# Patient Record
Sex: Female | Born: 1945 | Race: Black or African American | Hispanic: No | Smoking: Former smoker
Health system: Southern US, Community
[De-identification: ages and names within clinical notes are randomized; demographics above are authoritative.]

## PROBLEM LIST (undated history)

## (undated) DIAGNOSIS — I10 Essential (primary) hypertension: Secondary | ICD-10-CM

## (undated) DIAGNOSIS — N189 Chronic kidney disease, unspecified: Secondary | ICD-10-CM

## (undated) DIAGNOSIS — I219 Acute myocardial infarction, unspecified: Secondary | ICD-10-CM

## (undated) DIAGNOSIS — J449 Chronic obstructive pulmonary disease, unspecified: Secondary | ICD-10-CM

## (undated) DIAGNOSIS — E785 Hyperlipidemia, unspecified: Secondary | ICD-10-CM

## (undated) DIAGNOSIS — M1990 Unspecified osteoarthritis, unspecified site: Secondary | ICD-10-CM

## (undated) HISTORY — DX: Essential (primary) hypertension: I10

## (undated) HISTORY — DX: Hyperlipidemia, unspecified: E78.5

## (undated) HISTORY — DX: Chronic kidney disease, unspecified: N18.9

## (undated) HISTORY — DX: Unspecified osteoarthritis, unspecified site: M19.90

## (undated) HISTORY — DX: Acute myocardial infarction, unspecified: I21.9

## (undated) HISTORY — DX: Chronic obstructive pulmonary disease, unspecified: J44.9

---

## 2010-09-25 ENCOUNTER — Emergency Department: Payer: Self-pay | Admitting: Internal Medicine

## 2010-10-23 ENCOUNTER — Encounter: Payer: Self-pay | Admitting: Cardiology

## 2010-10-25 ENCOUNTER — Encounter: Payer: Self-pay | Admitting: Cardiology

## 2010-11-24 ENCOUNTER — Encounter: Payer: Self-pay | Admitting: Cardiology

## 2010-12-25 ENCOUNTER — Encounter: Payer: Self-pay | Admitting: Cardiology

## 2013-10-04 DIAGNOSIS — Z9889 Other specified postprocedural states: Secondary | ICD-10-CM | POA: Insufficient documentation

## 2013-10-04 DIAGNOSIS — I10 Essential (primary) hypertension: Secondary | ICD-10-CM | POA: Insufficient documentation

## 2013-10-04 DIAGNOSIS — I219 Acute myocardial infarction, unspecified: Secondary | ICD-10-CM | POA: Insufficient documentation

## 2013-10-04 DIAGNOSIS — E785 Hyperlipidemia, unspecified: Secondary | ICD-10-CM | POA: Insufficient documentation

## 2014-10-08 ENCOUNTER — Other Ambulatory Visit: Payer: Self-pay | Admitting: Family Medicine

## 2014-10-08 DIAGNOSIS — Z1231 Encounter for screening mammogram for malignant neoplasm of breast: Secondary | ICD-10-CM

## 2015-03-11 DIAGNOSIS — N183 Chronic kidney disease, stage 3 unspecified: Secondary | ICD-10-CM | POA: Insufficient documentation

## 2015-09-11 DIAGNOSIS — I5032 Chronic diastolic (congestive) heart failure: Secondary | ICD-10-CM | POA: Insufficient documentation

## 2016-09-28 DIAGNOSIS — M109 Gout, unspecified: Secondary | ICD-10-CM | POA: Insufficient documentation

## 2016-09-28 DIAGNOSIS — M329 Systemic lupus erythematosus, unspecified: Secondary | ICD-10-CM | POA: Insufficient documentation

## 2018-01-25 ENCOUNTER — Other Ambulatory Visit: Payer: Self-pay | Admitting: Family Medicine

## 2018-01-25 DIAGNOSIS — Z1231 Encounter for screening mammogram for malignant neoplasm of breast: Secondary | ICD-10-CM

## 2018-01-25 DIAGNOSIS — Z780 Asymptomatic menopausal state: Secondary | ICD-10-CM

## 2019-02-19 DIAGNOSIS — N2581 Secondary hyperparathyroidism of renal origin: Secondary | ICD-10-CM | POA: Insufficient documentation

## 2019-02-19 DIAGNOSIS — R600 Localized edema: Secondary | ICD-10-CM | POA: Insufficient documentation

## 2019-02-19 DIAGNOSIS — M109 Gout, unspecified: Secondary | ICD-10-CM | POA: Insufficient documentation

## 2019-03-07 ENCOUNTER — Ambulatory Visit: Payer: Medicare HMO

## 2019-04-04 DIAGNOSIS — M1A00X0 Idiopathic chronic gout, unspecified site, without tophus (tophi): Secondary | ICD-10-CM | POA: Insufficient documentation

## 2019-04-04 DIAGNOSIS — R768 Other specified abnormal immunological findings in serum: Secondary | ICD-10-CM | POA: Insufficient documentation

## 2019-04-04 DIAGNOSIS — I890 Lymphedema, not elsewhere classified: Secondary | ICD-10-CM | POA: Insufficient documentation

## 2019-04-04 DIAGNOSIS — M25561 Pain in right knee: Secondary | ICD-10-CM | POA: Insufficient documentation

## 2019-07-10 ENCOUNTER — Encounter: Payer: Self-pay | Admitting: Urology

## 2019-07-18 ENCOUNTER — Other Ambulatory Visit: Payer: Self-pay

## 2019-07-18 ENCOUNTER — Encounter: Payer: Self-pay | Admitting: Urology

## 2019-07-18 ENCOUNTER — Ambulatory Visit: Payer: Medicare HMO | Admitting: Urology

## 2019-07-18 VITALS — BP 119/65 | HR 65 | Ht 66.0 in | Wt 245.0 lb

## 2019-07-18 DIAGNOSIS — R8281 Pyuria: Secondary | ICD-10-CM | POA: Diagnosis not present

## 2019-07-18 DIAGNOSIS — N3941 Urge incontinence: Secondary | ICD-10-CM

## 2019-07-18 NOTE — Progress Notes (Signed)
   07/18/2019 8:56 AM   Bridget Murphy [birth year] [id number]  Referring provider: Lynnell Jude, MD [address] [place],  [place] [id number]  Chief Complaint  Patient presents with  . Hematuria    HPI: Bridget Murphy is a 74 yo seen at the request of Dr. Candiss Norse for evaluation of hematuria.  -Followed by nephrology for chronic kidney disease stage IIIa -UA 07/03/2019 with 20-40 RBC however 40-60 WBCs -Previous UAs dating back to 2019 show chronic pyuria with cultures showing mixed flora -Denies gross hematuria, dysuria -Mild urge incontinence -No flank, abdominal, pelvic pain -No recent upper tract imaging  PMH: History reviewed. No pertinent past medical history.  Surgical History: History reviewed. No pertinent surgical history.  Home Medications:  Allergies as of 07/18/2019      Reactions   Furosemide Rash      Medication List       Accurate as of Jul 18, 2019  8:56 AM. If you have any questions, ask your nurse or doctor.        allopurinol 100 MG tablet Commonly known as: ZYLOPRIM Take by mouth.   aspirin 81 MG EC tablet Take by mouth.   atorvastatin 40 MG tablet Commonly known as: LIPITOR Take by mouth.   clopidogrel 75 MG tablet Commonly known as: PLAVIX   diclofenac Sodium 1 % Gel Commonly known as: VOLTAREN Apply topically.   furosemide 20 MG tablet Commonly known as: LASIX   lisinopril-hydrochlorothiazide 10-12.5 MG tablet Commonly known as: ZESTORETIC Take by mouth.   metoprolol succinate 50 MG 24 hr tablet Commonly known as: TOPROL-XL Take by mouth.   nitroGLYCERIN 0.4 MG SL tablet Commonly known as: NITROSTAT   Tums Ultra 1000 400 MG chewable tablet Generic drug: calcium elemental as carbonate Chew by mouth.       Allergies:  Allergies  Allergen Reactions  . Furosemide Rash    Family History: History reviewed. No pertinent family history.  Social History:  reports that she has never smoked. She has never used smokeless  tobacco. She reports current alcohol use. No history on file for drug.   Physical Exam: BP 119/65   Pulse 65   Ht 5\' 6"  (1.676 m)   Wt 245 lb (111.1 kg)   BMI 39.54 kg/m   Constitutional:  Alert and oriented, No acute distress. HEENT: [place] AT, moist mucus membranes.  Trachea midline, no masses. Cardiovascular: No clubbing, cyanosis, or edema. Respiratory: Normal respiratory effort, no increased work of breathing. GI: Abdomen is soft, nontender, nondistended, no abdominal masses GU: No CVA tenderness Skin: No rashes, bruises or suspicious lesions. Neurologic: Grossly intact, no focal deficits, moving all 4 extremities. Psychiatric: Normal mood and affect.   Assessment & Plan:    1.  Pyuria UAs dating back to 2019 with chronic pyuria.  Most recent UA with microhematuria had significant pyuria at 40-60 WBCs.  UA today with 6-10 WBC/3-10 RBC. Recommend further evaluation with renal ultrasound and cystoscopy. The procedures were discussed and she desires to proceed   Abbie Sons, MD  [facility] [address], [place],  [address] [phone]

## 2019-07-18 NOTE — Addendum Note (Signed)
Addended by: Chrystie Nose on: 07/18/2019 09:17 AM   Modules accepted: Orders

## 2019-07-19 LAB — URINALYSIS, COMPLETE
Bilirubin, UA: NEGATIVE
Glucose, UA: NEGATIVE
Nitrite, UA: NEGATIVE
Specific Gravity, UA: >1.03 — ABNORMAL HIGH (ref 1.005–1.030)
Urobilinogen, Ur: 0.2 mg/dL (ref 0.2–1.0)
pH, UA: 5.5 (ref 5.0–7.5)

## 2019-07-19 LAB — MICROSCOPIC EXAMINATION

## 2019-07-22 LAB — CULTURE, URINE COMPREHENSIVE

## 2019-07-26 ENCOUNTER — Ambulatory Visit: Payer: Self-pay | Admitting: Urology

## 2019-08-02 ENCOUNTER — Ambulatory Visit: Admission: RE | Admit: 2019-08-02 | Payer: Self-pay | Source: Ambulatory Visit

## 2019-08-02 ENCOUNTER — Ambulatory Visit
Admission: RE | Admit: 2019-08-02 | Discharge: 2019-08-02 | Disposition: A | Discharge status: Home / Self Care | Payer: Medicare HMO | Source: Ambulatory Visit | Attending: Urology | Admitting: Urology

## 2019-08-02 ENCOUNTER — Other Ambulatory Visit: Payer: Self-pay

## 2019-08-02 DIAGNOSIS — R8281 Pyuria: Secondary | ICD-10-CM | POA: Insufficient documentation

## 2019-08-02 DIAGNOSIS — N3941 Urge incontinence: Secondary | ICD-10-CM | POA: Insufficient documentation

## 2019-08-02 DIAGNOSIS — Z79899 Other long term (current) drug therapy: Secondary | ICD-10-CM | POA: Diagnosis not present

## 2019-08-02 DIAGNOSIS — N1831 Chronic kidney disease, stage 3a: Secondary | ICD-10-CM | POA: Insufficient documentation

## 2019-08-18 ENCOUNTER — Encounter (INDEPENDENT_AMBULATORY_CARE_PROVIDER_SITE_OTHER): Payer: Self-pay

## 2019-08-18 ENCOUNTER — Other Ambulatory Visit: Payer: Self-pay

## 2019-08-18 ENCOUNTER — Other Ambulatory Visit: Payer: Self-pay | Admitting: Nephrology

## 2019-08-18 ENCOUNTER — Encounter: Payer: Self-pay | Admitting: Urology

## 2019-08-18 ENCOUNTER — Ambulatory Visit: Payer: Medicare HMO | Admitting: Urology

## 2019-08-18 VITALS — BP 113/71 | HR 52 | Ht 66.0 in | Wt 245.0 lb

## 2019-08-18 DIAGNOSIS — R3129 Other microscopic hematuria: Secondary | ICD-10-CM

## 2019-08-18 DIAGNOSIS — R8281 Pyuria: Secondary | ICD-10-CM | POA: Diagnosis not present

## 2019-08-18 DIAGNOSIS — N939 Abnormal uterine and vaginal bleeding, unspecified: Secondary | ICD-10-CM

## 2019-08-18 NOTE — Progress Notes (Signed)
° °  08/18/19  CC:  Chief Complaint  Patient presents with   Cysto    HPI: Refer to my prior note 07/18/2019.  Daughter states today she has had vaginal bleeding which has been intermittent and recurrent  Blood pressure 113/71, pulse (!) 52, height 5\' 6"  (1.676 m), weight 245 lb (111.1 kg). NED. A&Ox3.   No respiratory distress   Abd soft, NT, ND Atrophic external genitalia with patent urethral meatus; blood noted vaginal vault  Imaging: Normal renal ultrasound  Cystoscopy Procedure Note  Patient identification was confirmed, informed consent was obtained, and patient was prepped using Betadine solution.  Lidocaine jelly was administered per urethral meatus.    Procedure: - Flexible cystoscope introduced, without any difficulty.   - Thorough search of the bladder revealed:    normal urethral meatus    normal urothelium    no stones    no ulcers     no tumors    no urethral polyps    no trabeculation  - Ureteral orifices were normal in position and appearance.  Post-Procedure: - Patient tolerated the procedure well  Assessment/ Plan: -No mucosal abnormalities on cystoscopy -Questionable vaginal contamination on UAs with recurrent vaginal bleeding -Recommend gynecology evaluation -Catheterized urine obtained   Abbie Sons, MD

## 2019-08-21 ENCOUNTER — Telehealth: Payer: Self-pay | Admitting: Obstetrics & Gynecology

## 2019-08-21 LAB — URINALYSIS, COMPLETE
Bilirubin, UA: NEGATIVE
Glucose, UA: NEGATIVE
Nitrite, UA: NEGATIVE
Specific Gravity, UA: 1.025 (ref 1.005–1.030)
Urobilinogen, Ur: 0.2 mg/dL (ref 0.2–1.0)
pH, UA: 5 (ref 5.0–7.5)

## 2019-08-21 LAB — MICROSCOPIC EXAMINATION

## 2019-08-21 NOTE — Telephone Encounter (Signed)
[facility] referring for Vaginal bleeding. Called and left voicemail for patient to call back to be scheduled.

## 2019-08-22 LAB — CULTURE, URINE COMPREHENSIVE

## 2019-08-23 NOTE — Telephone Encounter (Signed)
Called and left voicemail for patient to call back to be scheduled. 

## 2019-08-24 NOTE — Telephone Encounter (Signed)
Called and left voicemail for patient to call back to be scheduled. 

## 2019-08-25 ENCOUNTER — Telehealth: Payer: Self-pay

## 2019-08-25 ENCOUNTER — Telehealth: Payer: Self-pay | Admitting: Family Medicine

## 2019-08-25 NOTE — Telephone Encounter (Signed)
-----   Message from Abbie Sons, MD sent at 08/25/2019  9:04 AM EDT ----- Urine culture showed no evidence of infection

## 2019-08-25 NOTE — Telephone Encounter (Signed)
Left pt mess to call no DPR on file

## 2019-08-25 NOTE — Telephone Encounter (Signed)
LMOM informed patient of normal urine culture.

## 2019-08-29 NOTE — Telephone Encounter (Signed)
Called and left voicemail for patient to call back to be scheduled. 

## 2019-09-08 ENCOUNTER — Ambulatory Visit: Admission: RE | Admit: 2019-09-08 | Payer: Medicare HMO | Source: Ambulatory Visit

## 2019-09-15 ENCOUNTER — Ambulatory Visit: Payer: Medicare HMO

## 2019-09-29 ENCOUNTER — Encounter: Payer: Self-pay | Admitting: Obstetrics and Gynecology

## 2019-09-29 ENCOUNTER — Ambulatory Visit (INDEPENDENT_AMBULATORY_CARE_PROVIDER_SITE_OTHER): Payer: Medicare HMO | Admitting: Obstetrics and Gynecology

## 2019-09-29 ENCOUNTER — Other Ambulatory Visit: Payer: Self-pay

## 2019-09-29 VITALS — BP 126/82 | Wt 254.0 lb

## 2019-09-29 DIAGNOSIS — N950 Postmenopausal bleeding: Secondary | ICD-10-CM

## 2019-09-29 NOTE — Progress Notes (Signed)
Gynecology H&P  Chief Complaint:  Chief Complaint  Patient presents with   Vaginal Bleeding    Referred by [facility]    History of Present Illness: Patient is a 74 y.o. No obstetric history on file. presents evaluation of postmenopausal bleeding. The patient states she has had a multiple episode(s) of bleeding in the past several year(s).  he bleeding has been limited to spotting and has not contained clots . She describes the blood as pink to dark brown in appearance.  She has had no additional complaints. She deniestrauma or other inciting event, bloating, cramping and early satiety. Does occasionally have lower abdominal pain.  She does not have a history of abnormal pap smears. She is on Plavix.  There are no other aggravating factors reported. There are no alleviating factors reported. The patient's past medical history is notable for renal disease, .   She has nothad prior work up for postmenopausal bleeding.  The patient also reports that she has an IUD in place placed in the 1980's.   Review of Systems: 10 point review of systems negative unless otherwise noted in HPI  Past Medical History:  Patient Active Problem List   Diagnosis Date Noted   Bilateral chronic knee pain 04/04/2019   Ds DNA antibody positive 04/04/2019    Formatting of this note might be different from the original.   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 Toes both feet    Systemic lupus erythematosus ([facility]) 09/28/2016   Chronic diastolic CHF (congestive heart failure) ([facility]) 09/11/2015   Stage 3 chronic kidney disease 03/11/2015   Acute myocardial infarction, unspecified ([facility]) 10/04/2013    Formatting of this note might be  different from the original. ACUTE ANTERIOR MI 09/25/10 Formatting of this note might be different from the original. ACUTE ANTERIOR MI 09/25/10    Essential hypertension 10/04/2013   Hyperlipidemia 10/04/2013   Morbid obesity ([facility]) 10/04/2013   Other specified postprocedural states 10/04/2013    Formatting of this note might be different from the original. 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  Family History:  No family history on file.  Social History:  Social History   Socioeconomic History   Marital status: Widowed    Spouse name: Not on file   Number of children: Not on file   Years of education: Not on file   Highest education level: Not on file  Occupational History   Not on file  Tobacco Use   Smoking status: Never Smoker   Smokeless tobacco: Never Used  Substance and Sexual Activity   Alcohol use: Yes   Drug use: Not on file   Sexual activity: Not on file  Other Topics Concern   Not on file  Social History Narrative   Not on file   Social Determinants of Health   Financial Resource Strain:    Difficulty of Paying Living Expenses:   Food Insecurity:    Worried About [place] in the Last Year:  Ran Out of Food in the Last Year:   Transportation Needs:    Film/video editor (Medical):    Lack of Transportation (Non-Medical):   Physical Activity:    Days of Exercise per Week:    Minutes of Exercise per Session:   Stress:    Feeling of Stress :   Social Connections:    Frequency of Communication with Friends and Family:    Frequency of Social Gatherings with Friends and Family:    Attends Religious Services:    Active Member of Clubs or Organizations:     Attends Archivist Meetings:    Marital Status:   Intimate Partner Violence:    Fear of Current or Ex-Partner:    Emotionally Abused:    Physically Abused:    Sexually Abused:     Allergies:  Allergies  Allergen Reactions   Furosemide Rash    Medications: Prior to Admission medications   Medication Sig Start Date End Date Taking? Authorizing Provider  allopurinol (ZYLOPRIM) 100 MG tablet Take by mouth. 08/15/15   [provider]  aspirin 81 MG EC tablet Take by mouth.    [provider]  atorvastatin (LIPITOR) 40 MG tablet Take by mouth.    [provider]  calcium elemental as carbonate (TUMS ULTRA 1000) 400 MG chewable tablet Chew by mouth.    [provider]  clopidogrel (PLAVIX) 75 MG tablet  07/08/19   [provider]  diclofenac Sodium (VOLTAREN) 1 % GEL Apply topically. 05/30/19   [provider]  furosemide (LASIX) 20 MG tablet  02/27/19   [provider]  lisinopril-hydrochlorothiazide (ZESTORETIC) 10-12.5 MG tablet Take by mouth.    [provider]  metoprolol succinate (TOPROL-XL) 50 MG 24 hr tablet Take by mouth.    [provider]  nitroGLYCERIN (NITROSTAT) 0.4 MG SL tablet  02/28/19   [provider]    Physical Exam Vitals: There were no vitals taken for this visit.  General: NAD HEENT: normocephalic, anicteric Pulmonary: No increased work of breathing Extremities: no edema, erythema, or tenderness Neurologic: Grossly intact Psychiatric: mood appropriate, affect full  Assessment: 74 y.o. No obstetric history on file. presenting for evaluation of postmenopausal bleeding  Plan: Problem List Items Addressed This Visit    None    Visit Diagnoses    Postmenopausal bleeding    -  Primary   Relevant Orders   US Transvaginal Non-OB      1) We discussed that menopause is a clinical diagnosis made after 12 months of amenorrhea.  The average age of menopause  in the  General [place] population is 84 but there may be significant variation.  Any bleeding that happens after a 12 month period of amenorrhea warrants further work.  Possible etiologies of postmenopausal bleeding were discussed with the patient today.  These may range from benign etiologies such as urethral prolapse and atrophy, to indeterminate lesions such as submucosal fibroids or polyps which would require resection to accurately evaluate. The role of unopposed estrogen in the development of  dndometrial hyperplasia or carcinoma is discussed.  The risk of endometrial hyperplasia is linearly correlated with increasing BMI given the production of estrone by adipose tissue.  Work up will be include transvaginal ultrasound to assess the thickness of the endometrial lining as well as to assess for focal uterine lesions.  Negative ultrasound evaluation, defined as the absence of focal lesions and endometrial stripe of <53mm, effectively rules out carcinoma and confirms atrophy as the  most likely etiology.  Should focal lesions be present these generally require hysteroscopic resection.  Should lining be greater >69mm endometrial biopsy is warranted to rule out hyperplasia or frank endometrial cancer.  Continued episodes of bleeding despite negative ultrasound also warrant endometrial sampling.  As the cervical pathology may also be implicated in postmenopausal bleeding prior cervical cytology was reviewed and repeated if required per ASCCP guidelines.   2) Evaluation by pelvc ultrasound scheduled, with follow up after [place]. EMB discussed and may be performed as well. Pros and cons of these modalities of testing discussed. If IUD is confirmed to be in place will attempt removal.  Will ensure procedure room is available given patient's limited mobility  3) Return in about 2 weeks (around 10/13/2019) for 1.5 - 2 weeks TVUS and follow up (procedure room).   Malachy Mood, MD, [facility], [facility] 09/29/2019, 1:28 PM

## 2019-10-23 ENCOUNTER — Other Ambulatory Visit: Payer: Self-pay | Admitting: Obstetrics and Gynecology

## 2019-10-23 ENCOUNTER — Ambulatory Visit (INDEPENDENT_AMBULATORY_CARE_PROVIDER_SITE_OTHER): Payer: Medicare HMO | Admitting: Obstetrics and Gynecology

## 2019-10-23 ENCOUNTER — Other Ambulatory Visit (HOSPITAL_COMMUNITY)
Admission: RE | Admit: 2019-10-23 | Discharge: 2019-10-23 | Disposition: A | Discharge status: Home / Self Care | Payer: Medicare HMO | Source: Ambulatory Visit | Attending: Obstetrics and Gynecology | Admitting: Obstetrics and Gynecology

## 2019-10-23 ENCOUNTER — Other Ambulatory Visit: Payer: Self-pay

## 2019-10-23 ENCOUNTER — Ambulatory Visit (INDEPENDENT_AMBULATORY_CARE_PROVIDER_SITE_OTHER): Payer: Medicare HMO

## 2019-10-23 DIAGNOSIS — N950 Postmenopausal bleeding: Secondary | ICD-10-CM

## 2019-10-23 DIAGNOSIS — R9389 Abnormal findings on diagnostic imaging of other specified body structures: Secondary | ICD-10-CM | POA: Insufficient documentation

## 2019-10-23 DIAGNOSIS — N840 Polyp of corpus uteri: Secondary | ICD-10-CM | POA: Insufficient documentation

## 2019-10-23 NOTE — Progress Notes (Signed)
Gynecology Ultrasound Follow Up  Chief Complaint: No chief complaint on file.    History of Present Illness: Patient is a 74 y.o. female who presents today for ultrasound evaluation of PMB .  Ultrasound demonstrates the following findgins Adnexa: no masses seen Uterus: Non-enlarged with endometrial stripe heterogenous and measuring up to 70mm Additional: NO IUD VISUALIZED  Review of Systems: Review of Systems  Constitutional: Negative.   Gastrointestinal: Negative.   Genitourinary: Negative.   Neurological: Seizures:      Past Medical History:  No past medical history on file.  Past Surgical History:  No past surgical history on file.  Gynecologic History:  No LMP recorded. Contraception: post menopausal status  Family History:  No family history on file.  Social History:  Social History   Socioeconomic History  . Marital status: Widowed    Spouse name: Not on file  . Number of children: Not on file  . Years of education: Not on file  . Highest education level: Not on file  Occupational History  . Not on file  Tobacco Use  . Smoking status: Never Smoker  . Smokeless tobacco: Never Used  Substance and Sexual Activity  . Alcohol use: Yes  . Drug use: Not on file  . Sexual activity: Not on file  Other Topics Concern  . Not on file  Social History Narrative  . Not on file   Social Determinants of Health   Financial Resource Strain:   . Difficulty of Paying Living Expenses: Not on file  Food Insecurity:   . Worried About Charity fundraiser in the Last Year: Not on file  . Ran Out of Food in the Last Year: Not on file  Transportation Needs:   . Lack of Transportation (Medical): Not on file  . Lack of Transportation (Non-Medical): Not on file  Physical Activity:   . Days of Exercise per Week: Not on file  . Minutes of Exercise per Session: Not on file  Stress:   . Feeling of Stress : Not on file  Social Connections:   . Frequency of  Communication with Friends and Family: Not on file  . Frequency of Social Gatherings with Friends and Family: Not on file  . Attends Religious Services: Not on file  . Active Member of Clubs or Organizations: Not on file  . Attends Archivist Meetings: Not on file  . Marital Status: Not on file  Intimate Partner Violence:   . Fear of Current or Ex-Partner: Not on file  . Emotionally Abused: Not on file  . Physically Abused: Not on file  . Sexually Abused: Not on file    Allergies:  Allergies  Allergen Reactions  . Furosemide Rash    Medications: Prior to Admission medications   Medication Sig Start Date End Date Taking? Authorizing Provider  allopurinol (ZYLOPRIM) 100 MG tablet Take by mouth. 08/15/15   [provider]  aspirin 81 MG EC tablet Take by mouth.    [provider]  atorvastatin (LIPITOR) 40 MG tablet Take by mouth.    [provider]  calcium elemental as carbonate (TUMS ULTRA 1000) 400 MG chewable tablet Chew by mouth.    [provider]  clopidogrel (PLAVIX) 75 MG tablet  07/08/19   [provider]  diclofenac Sodium (VOLTAREN) 1 % GEL Apply topically. 05/30/19   [provider]  furosemide (LASIX) 20 MG tablet  02/27/19   [provider]  lisinopril-hydrochlorothiazide (ZESTORETIC) 10-12.5 MG tablet  Take by mouth.    [provider]  metoprolol succinate (TOPROL-XL) 50 MG 24 hr tablet Take by mouth.    [provider]  nitroGLYCERIN (NITROSTAT) 0.4 MG SL tablet  02/28/19   [provider]    Physical Exam Vitals: There were no vitals taken for this visit.  General: NAD HEENT: normocephalic, anicteric Pulmonary: No increased work of breathing Extremities: no edema, erythema, or tenderness Neurologic: Grossly intact, normal gait Psychiatric: mood appropriate, affect full  US PELVIC COMPLETE WITH TRANSVAGINAL  Result Date: 10/23/2019 Patient Name: Bridget Murphy DOB:  Mar 21, 1945 MRN: [id number] ULTRASOUND REPORT Location: [facility] Date of Service: 10/23/2019 Indications: Postmenopausal bleeding Findings: The uterus is anteverted and measures 11.3 x 7.0 x 6.0 cm. Echo texture is heterogenous with evidence of focal masses. Within the uterus There are possibly submucosal fibroids vs. Polyps measuring 25.2 x 21.6 x 31.3 mm, 16.7 x 8.3 x 13.8 mm, 12.9 x 8.9 x 12.3 mm. There is also a thick endometrium. The lining in the cervical canal is thick also. The Endometrium measures 29.0 mm. No IUD is seen. Right Ovary measures 3.2 x 2.1 x 1.7 cm. It is normal in appearance. There is a follicle in the right ovary measuring 12.0 x 7.9 x 11.0 mm Left Ovary measures 2.5 x 2.2 x 1.0 cm. It is normal in appearance. Survey of the adnexa demonstrates no adnexal masses. There is no free fluid in the cul de sac. Impression: 1. There are possible fibroids and or polyps within the endometrium canal. 2. The endometrial lining appears thick, as well as, the cervical canal lining. 3. The ovaries appear normal. 4. No IUD is seen. Recommendations: 1.Clinical correlation with the patient's History and Physical Exam. Gweneth Dimitri, RT Images reviewed.  The endometrial lining appears thickened in the setting of postmenopausal bleeding.  There is evidence of focal lesions within the endometrium likely representing endometrial polyps. Malachy Mood, MD, [facility], [facility] 10/23/2019, 9:31 AM     ENDOMETRIAL BIOPSY     The indications for endometrial biopsy were reviewed.   Risks of the biopsy including cramping, bleeding, infection, uterine perforation, inadequate specimen and need for additional procedures  were discussed. The patient states she understands and agrees to undergo procedure today. Consent was signed. Time out was performed. Urine HCG was negative. A Graves speculum was placed and the cervix was brought into view.  The cervix was prepped with Betadine. A  single-toothed tenaculum was  placed on the anterior lip of the cervix for traction. A 3 mm pipelle was introduced through the cervix into the endometrial cavity without difficulty to a depth of 8cm, and a large amount of tissue was obtained, the resulting specime sent to pathology. The instruments were removed from the patient's vagina. Minimal bleeding from the cervix was noted. The patient tolerated the procedure well. Routine post-procedure instructions were given to the patient.  She will be contacted by phone one results become available.     Assessment: 74 y.o. postmenopausal bleeding work up  Plan: Problem List Items Addressed This Visit    None      1) PMB - We discussed that menopause is a clinical diagnosis made after 12 months of amenorrhea.  The average age of menopause in the  General [place] population is 28 but there may be significant variation.  Any bleeding that happens after a 12 month period of amenorrhea warrants further work.  Possible etiologies of postmenopausal bleeding were discussed with  the patient today.  These may range from benign etiologies such as urethral prolapse and atrophy, to indeterminate lesions such as submucosal fibroids or polyps which would require resection to accurately evaluate. The role of unopposed estrogen in the development of endometrial hyperplasia or carcinoma is discussed.  The risk of endometrial hyperplasia is linearly correlated with increasing BMI given the production of estrone by adipose tissue.  Work up will be include transvaginal ultrasound to assess the thickness of the endometrial lining as well as to assess for focal uterine lesions.  Negative ultrasound evaluation, defined as the absence of focal lesions and endometrial stripe of <45mm, effectively rules out carcinoma and confirms atrophy as the most likely etiology.  Should focal lesions be present these generally require hysteroscopic resection. Lining on today's ultrasound wasgreater >17mm  endometrial biopsy was warranted to rule out hyperplasia or frank endometrial cancer.   - EMBx today - post hysteroscopy D&C given focal lesion  2) A total of 15 minutes were spent in face-to-face contact with the patient during this encounter with over half of that time devoted to counseling and coordination of care.  3) Return if symptoms worsen or fail to improve.   Malachy Mood, MD, [facility], [facility] 10/23/2019, 9:46 AM

## 2019-10-24 ENCOUNTER — Telehealth: Payer: Self-pay | Admitting: Obstetrics and Gynecology

## 2019-10-24 LAB — SURGICAL PATHOLOGY

## 2019-10-24 NOTE — Telephone Encounter (Signed)
Attempted to contact patient to go over pathology results and set up Gynecology-Oncology referral but only got voicemail.  Instructed to call back office

## 2019-10-24 NOTE — Telephone Encounter (Signed)
Patient is calling back to speak with AMS.

## 2019-10-26 ENCOUNTER — Other Ambulatory Visit: Payer: Self-pay | Admitting: Obstetrics and Gynecology

## 2019-10-26 DIAGNOSIS — C541 Malignant neoplasm of endometrium: Secondary | ICD-10-CM

## 2019-10-26 NOTE — Progress Notes (Signed)
Called patient to inform of biopsy results.  Also updated daughter-in-law after talking with Mrs Abdo as she is her ride.  Haywood Filler, daughter-in-law, [phone] . It is up to date

## 2019-11-01 ENCOUNTER — Inpatient Hospital Stay: Payer: Medicare HMO | Attending: Obstetrics and Gynecology | Admitting: Obstetrics and Gynecology

## 2019-11-01 ENCOUNTER — Inpatient Hospital Stay: Payer: Medicare HMO

## 2019-11-01 ENCOUNTER — Other Ambulatory Visit: Payer: Self-pay

## 2019-11-01 VITALS — BP 128/62 | HR 56 | Temp 97.8°F | Resp 20 | Wt 233.0 lb

## 2019-11-01 DIAGNOSIS — Z7902 Long term (current) use of antithrombotics/antiplatelets: Secondary | ICD-10-CM | POA: Insufficient documentation

## 2019-11-01 DIAGNOSIS — D259 Leiomyoma of uterus, unspecified: Secondary | ICD-10-CM | POA: Diagnosis not present

## 2019-11-01 DIAGNOSIS — I5032 Chronic diastolic (congestive) heart failure: Secondary | ICD-10-CM | POA: Diagnosis not present

## 2019-11-01 DIAGNOSIS — Z79899 Other long term (current) drug therapy: Secondary | ICD-10-CM | POA: Diagnosis not present

## 2019-11-01 DIAGNOSIS — E785 Hyperlipidemia, unspecified: Secondary | ICD-10-CM | POA: Diagnosis not present

## 2019-11-01 DIAGNOSIS — Z791 Long term (current) use of non-steroidal anti-inflammatories (NSAID): Secondary | ICD-10-CM | POA: Insufficient documentation

## 2019-11-01 DIAGNOSIS — J449 Chronic obstructive pulmonary disease, unspecified: Secondary | ICD-10-CM | POA: Insufficient documentation

## 2019-11-01 DIAGNOSIS — G8929 Other chronic pain: Secondary | ICD-10-CM | POA: Insufficient documentation

## 2019-11-01 DIAGNOSIS — R7309 Other abnormal glucose: Secondary | ICD-10-CM | POA: Insufficient documentation

## 2019-11-01 DIAGNOSIS — C541 Malignant neoplasm of endometrium: Secondary | ICD-10-CM

## 2019-11-01 DIAGNOSIS — I252 Old myocardial infarction: Secondary | ICD-10-CM | POA: Insufficient documentation

## 2019-11-01 DIAGNOSIS — I130 Hypertensive heart and chronic kidney disease with heart failure and stage 1 through stage 4 chronic kidney disease, or unspecified chronic kidney disease: Secondary | ICD-10-CM | POA: Insufficient documentation

## 2019-11-01 DIAGNOSIS — N183 Chronic kidney disease, stage 3 unspecified: Secondary | ICD-10-CM | POA: Insufficient documentation

## 2019-11-01 DIAGNOSIS — Z7982 Long term (current) use of aspirin: Secondary | ICD-10-CM | POA: Insufficient documentation

## 2019-11-01 LAB — COMPREHENSIVE METABOLIC PANEL
ALT: 9 U/L (ref 0–44)
AST: 18 U/L (ref 15–41)
Albumin: 3.6 g/dL (ref 3.5–5.0)
Alkaline Phosphatase: 76 U/L (ref 38–126)
Anion gap: 10 (ref 5–15)
BUN: 24 mg/dL — ABNORMAL HIGH (ref 8–23)
CO2: 25 mmol/L (ref 22–32)
Calcium: 9.1 mg/dL (ref 8.9–10.3)
Chloride: 105 mmol/L (ref 98–111)
Creatinine, Ser: 1.18 mg/dL — ABNORMAL HIGH (ref 0.44–1.00)
GFR calc Af Amer: 52 mL/min — ABNORMAL LOW (ref >60)
GFR calc non Af Amer: 45 mL/min — ABNORMAL LOW (ref >60)
Glucose, Bld: 108 mg/dL — ABNORMAL HIGH (ref 70–99)
Potassium: 4.4 mmol/L (ref 3.5–5.1)
Sodium: 140 mmol/L (ref 135–145)
Total Bilirubin: 0.7 mg/dL (ref 0.3–1.2)
Total Protein: 7.3 g/dL (ref 6.5–8.1)

## 2019-11-01 LAB — CBC WITH DIFFERENTIAL/PLATELET
Abs Immature Granulocytes: 0.03 10*3/uL (ref 0.00–0.07)
Basophils Absolute: 0.1 10*3/uL (ref 0.0–0.1)
Basophils Relative: 1 %
Eosinophils Absolute: 0.2 10*3/uL (ref 0.0–0.5)
Eosinophils Relative: 2 %
HCT: 32.8 % — ABNORMAL LOW (ref 36.0–46.0)
Hemoglobin: 10.3 g/dL — ABNORMAL LOW (ref 12.0–15.0)
Immature Granulocytes: 0 %
Lymphocytes Relative: 16 %
Lymphs Abs: 1.3 10*3/uL (ref 0.7–4.0)
MCH: 26.5 pg (ref 26.0–34.0)
MCHC: 31.4 g/dL (ref 30.0–36.0)
MCV: 84.5 fL (ref 80.0–100.0)
Monocytes Absolute: 0.4 10*3/uL (ref 0.1–1.0)
Monocytes Relative: 5 %
Neutro Abs: 6.3 10*3/uL (ref 1.7–7.7)
Neutrophils Relative %: 76 %
Platelets: 215 10*3/uL (ref 150–400)
RBC: 3.88 MIL/uL (ref 3.87–5.11)
RDW: 15.5 % (ref 11.5–15.5)
WBC: 8.2 10*3/uL (ref 4.0–10.5)
nRBC: 0 % (ref 0.0–0.2)

## 2019-11-01 NOTE — Progress Notes (Addendum)
Gynecologic Oncology Consult Visit   Referring Provider: Malachy Mood, MD [address] [place],  [place] [address] [phone]  Chief Concern: high-grade endometrial adenocarcinoma  Subjective:  Bridget Murphy is a 74 y.o. female who is seen in consultation from Dr. Georgianne Fick for high-grade endometrial adenocarcinoma.  She presented with postmenopausal bleeding.   Pelvic US 10/23/2019 The uterus is anteverted and measures 11.3 x 7.0 x 6.0 cm. Echo texture is heterogenous with evidence of focal masses. Within the uterus There are possibly submucosal fibroids vs. Polyps measuring 25.2 x 21.6 x 31.3 mm, 16.7 x 8.3 x 13.8 mm, 12.9 x 8.9 x 12.3 mm. There is also a thick endometrium. The lining in the cervical canal is thick also.   The Endometrium measures 29.0 mm. No IUD is seen.   Right Ovary measures 3.2 x 2.1 x 1.7 cm. It is normal in appearance. There is a follicle in the right ovary measuring 12.0 x 7.9 x 11.0 mm Left Ovary measures 2.5 x 2.2 x 1.0 cm. It is normal in appearance. Survey of the adnexa demonstrates no adnexal masses. There is no free fluid in the cul de sac.  Impression: 1. There are possible fibroids and or polyps within the endometrium canal.  2. The endometrial lining appears thick, as well as, the cervical canal lining.  3. The ovaries appear normal.  4. No IUD is seen.    10/23/2019 Endometrial biopsy performed   A. ENDOMETRIUM, BIOPSY:  - High-grade endometrial adenocarcinoma, FIGO grade 3.  - The carcinoma has mostly endometrioid features.   She presents today to discuss management options. She also has significant medical history with h/o MI, CHF, COPD, stage 3 chronic kidney disease, and Body mass index is 37.61 kg/m. She doesn't have chest pain or shortness of breath. She can walk around her house and grocery store. She can walk up the 4 steps in/out of her home. She does not walk up a flight of stairs or walk a block.    Problem  List: Patient Active Problem List   Diagnosis Date Noted  . Endometrial adenocarcinoma ([place]) 11/01/2019  . Bilateral chronic knee pain 04/04/2019  . Ds DNA antibody positive 04/04/2019  . Lymphedema of both lower extremities 04/04/2019  . Hyperparathyroidism due to renal insufficiency ([place]) 02/19/2019  . Acute gout involving toe 09/28/2016  . Systemic lupus erythematosus ([place]) 09/28/2016  . Chronic diastolic CHF (congestive heart failure) ([place]) 09/11/2015  . Stage 3 chronic kidney disease 03/11/2015  . Acute myocardial infarction, unspecified ([place]) 10/04/2013  . Essential hypertension 10/04/2013  . Hyperlipidemia 10/04/2013  . Morbid obesity ([place]) 10/04/2013  . Other specified postprocedural states 10/04/2013    Past Medical History: Past Medical History:  Diagnosis Date  . Acute myocardial infarction [facility])    s/p three stents; anterior STEMI, DES LAD 09/25/2010  . Arthritis   . Chronic kidney disease   . COPD (chronic obstructive pulmonary disease) ([place])   . Hyperlipidemia   . Hypertension   . 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 No pertinent surgical history.  Past Gynecologic History:  As per HPI  OB History:  OB History  Gravida Para Term Preterm AB Living  1 1          SAB TAB Ectopic Multiple Live Births               # Outcome Date GA Lbr Len/2nd Weight Sex Delivery Anes PTL Lv  1 Para  Obstetric Comments  SVD x 1    Family History: Family History  Problem Relation Age of Onset  . Cancer Mother   . Cancer Sister   . Cancer Brother     Social History: Social History   Socioeconomic History  . Marital status: Widowed    Spouse name: Not on file  . Number of children: Not on file  . Years of education: Not on file  . Highest education level: Not on file  Occupational History  . Not on file  Tobacco Use  . Smoking status: Never Smoker  . Smokeless tobacco: Never Used  Vaping Use  . Vaping Use: Never  used  Substance and Sexual Activity  . Alcohol use: Not Currently  . Drug use: Not on file  . Sexual activity: Not on file  Other Topics Concern  . Not on file  Social History Narrative  . Not on file   Social Determinants of Health   Financial Resource Strain:   . Difficulty of Paying Living Expenses: Not on file  Food Insecurity:   . Worried About Charity fundraiser in the Last Year: Not on file  . Ran Out of Food in the Last Year: Not on file  Transportation Needs:   . Lack of Transportation (Medical): Not on file  . Lack of Transportation (Non-Medical): Not on file  Physical Activity:   . Days of Exercise per Week: Not on file  . Minutes of Exercise per Session: Not on file  Stress:   . Feeling of Stress : Not on file  Social Connections:   . Frequency of Communication with Friends and Family: Not on file  . Frequency of Social Gatherings with Friends and Family: Not on file  . Attends Religious Services: Not on file  . Active Member of Clubs or Organizations: Not on file  . Attends Archivist Meetings: Not on file  . Marital Status: Not on file  Intimate Partner Violence:   . Fear of Current or Ex-Partner: Not on file  . Emotionally Abused: Not on file  . Physically Abused: Not on file  . Sexually Abused: Not on file    Allergies: Allergies  Allergen Reactions  . Furosemide Rash    Current Medications: Current Outpatient Medications  Medication Sig Dispense Refill  . aspirin 81 MG EC tablet Take by mouth.    Marland Kitchen atorvastatin (LIPITOR) 40 MG tablet Take by mouth.    . clopidogrel (PLAVIX) 75 MG tablet     . diclofenac Sodium (VOLTAREN) 1 % GEL Apply topically.    . furosemide (LASIX) 20 MG tablet     . ibuprofen (ADVIL) 100 MG/5ML suspension Take 200 mg by mouth every 4 (four) hours as needed.    Marland Kitchen lisinopril-hydrochlorothiazide (ZESTORETIC) 10-12.5 MG tablet Take by mouth.    . metoprolol succinate (TOPROL-XL) 50 MG 24 hr tablet Take by mouth.     . nitroGLYCERIN (NITROSTAT) 0.4 MG SL tablet     . allopurinol (ZYLOPRIM) 100 MG tablet Take by mouth.    . calcium elemental as carbonate (TUMS ULTRA 1000) 400 MG chewable tablet Chew by mouth. (Patient not taking: Reported on 11/01/2019)     No current facility-administered medications for this visit.    Review of Systems General: negative for fevers, changes in weight or night sweats Skin: negative for changes in moles or sores or rash Eyes: negative for changes in vision HEENT: negative for change in hearing, tinnitus, voice changes Pulmonary: negative for  dyspnea, orthopnea, productive cough, wheezing Cardiac: negative for palpitations, pain Gastrointestinal: negative for nausea, vomiting, constipation, diarrhea, hematemesis, hematochezia Genitourinary/Sexual: negative for dysuria, retention, hematuria, incontinence Ob/Gyn:   abnormal bleeding Musculoskeletal: negative for pain, joint pain, back pain Hematology: negative for easy bruising, abnormal bleeding Neurologic/Psych: negative for headaches, seizures, paralysis, weakness, numbness  Objective:  Physical Examination:  BP 128/62   Pulse (!) 56   Temp 97.8 F (36.6 C)   Resp 20   Wt 233 lb (105.7 kg)   SpO2 100%   BMI 37.61 kg/m    ECOG Performance Status: 1 - Symptomatic but completely ambulatory  GENERAL: Patient is a female, appears stated age, in no acute distress HEENT:  PERRL, neck supple with midline trachea. No adenopathy.  NODES:  No cervical, supraclavicular, axillary, or inguinal lymphadenopathy palpated.  LUNGS:  Clear to auscultation bilaterally.  No wheezes or rhonchi. HEART:  Regular rate and rhythm.  ABDOMEN:  Soft, nontender. No masses or ascites or hernias.  EXTREMITIES:  No peripheral edema.   SKIN:  Clear with no obvious rashes or skin changes. No nail dyscrasia. NEURO:  Nonfocal. Well oriented.  Appropriate affect.  Pelvic: EGBUS: no lesions Cervix: no lesions, nontender, mobile Vagina: no  lesions, no discharge or bleeding Uterus: nontender, mobile, exam limited by habitus Adnexa: no palpable masses or nodularity.  Rectovaginal: not indicated    Lab Review Labs on site today: CBC, CMP, HbA1c  Radiologic Imaging: CT C/A/P       Assessment:  Chanin Frumkin is a 74 y.o. female diagnosed with high grade endometrial cancer.   Medical co-morbidities complicating care: HTN and obesity, prior MI s/p 3 stents on Plavix, h/o CHF, chronic kidney disease, and COPD.  Plan:   Problem List Items Addressed This Visit      Genitourinary   Endometrial adenocarcinoma ([place]) - Primary   Relevant Orders   Comprehensive metabolic panel (Completed)   CBC with Differential/Platelet (Completed)   Hemoglobin A1c   CT CHEST ABDOMEN PELVIS W CONTRAST      We discussed options for management including surgery and radiation. Hormonal therapy is not an option given grade of disease. She does have significant medical issues including cardiopulmonary disease and cardiology clearance will be needed. She does not think she has had a stress test recently. We will reach out to her cardiologist. She will probably need EKG, ECHO, and possibly dobutamine stress test.   We also ordered CBC, CMP and HbA1c as well as CT C/A/P to asess for metastatic disease.   Risks were discussed in detail. These include infection, anesthesia, bleeding, transfusion, wound separation, vaginal cuff dehiscence, medical issues (blood clots, stroke, heart attack, fluid in the lungs, pneumonia, abnormal heart rhythm, death), possible exploratory surgery with larger incision, lymphedema, lymphocyst, allergic reaction, injury to adjacent organs (bowel, bladder, blood vessels, nerves, ureters, uterus).   She will need VTE prophylaxis. Hold her Plavix 5 days before surgery and restart postoperatively.   I spoke with Dr. Georgianne Fick about her surgery. Given her comorbid conditions our plan will be to do her surgery at [facility]. I will need  to discuss this with her and her daughter.   Suggested return to clinic in  1-2 weeks postoperatively to review pathology.    The patient's diagnosis, an outline of the further diagnostic and laboratory studies which will be required, the recommendation, and alternatives were discussed.  All questions were answered to the patient's satisfaction.  A total of 80 minutes were spent with the patient/family today; >  50% was spent in education, counseling and coordination of care for endometrial cancer.    Bridget Murphy Gaetana Michaelis, MD    CC:  Malachy Mood, MD [address] [place],  [place] [address] [phone]

## 2019-11-01 NOTE — Patient Instructions (Addendum)
Cardiology Clearance with Dr. Saralyn Pilar on Monday at 11:45   In Preparation of your upcoming procedure you are having testing for Covid-19 on Monday, November 13, 2019 at the [facility]. This is a drive-thru testing site. Please come between 8:00am-1:00pm.  We are asking that you stay at home and avoid visitors from this point until after your procedure is completed to minimize potential for Covid-19 exposure.    Please Wash your hands frequently with soap and water or clean hands with an alcohol-based hand sanitizer often.  Do not touch your eyes, nose, and mouth, especially with unwashed hands.  Should you at any time develop new symptoms of fever, cough, sneezing or runny nose, sore throat, difficulty breathing, or unexplained body aches, we ask that you contact your provider.   It may take up to 48 hours for results of this testing to be made available.   You will not receive notification if test results are negative.  If test results are positive for Covid-19, your provider or his/her representative will notify you by phone, with additional instructions.                 DIVISION OF GYNECOLOGIC ONCOLOGY BOWEL PREP   The following instructions are extremely important to prepare for your surgery. Please follow them carefully   Step 1: Liquid Diet Instructions              Clear Liquid Diet for GYN Oncology Patients Day Before Surgery The day before your scheduled surgery DO NOT EAT any solid foods.  We do want you to drink enough liquids, but NO MILK products.  We do not want you to be dehydrated.  Clear liquids are defined as no milk products and no pieces of any solid food. Drink at least 64 oz. of fluid.  The following are all approved for you to drink the day before you surgery.  Chicken, Beef or Vegetable Broth (bouillon or consomm) - NO BROTH AFTER MIDNIGHT  Plain Jello  (no fruit)  Water  Strained lemonade or fruit punch  Gatorade (any flavor)  CLEAR  Ensure or Boost Breeze  Fruit juices without pulp, such as apple, grape, or cranberry juice  Clear sodas - NO SODA AFTER MIDNIGHT  Ice Pops without bits of fruit or fruit pulp  Honey  Tea or coffee without milk or cream                 Any foods not on the above list should be avoided                                                                                             Step 2: Laxatives           The evening before surgery:   Time: around 5pm   Follow these instructions carefully.   Administer 1 Dulcolax suppository according to manufacturer instructions on the box. You will need to purchase this laxative at a pharmacy or grocery store.    Individual responses to laxatives vary; this prep may cause multiple bowel movements. It often works in 30 minutes and  may take as long as 3 hours. Stay near an available bathroom.    It is important to stay hydrated. Ensure you are still drinking clear liquids.     IMPORTANT: FOR YOUR SAFETY, WE WILL HAVE TO CANCEL YOUR SURGERY IF YOU DO NOT FOLLOW THESE INSTRUCTIONS.    Do not eat anything after midnight (including gum or candy) prior to your surgery.  Avoid drinking carbonated beverages after midnight.  You can have clear liquids up until one hour before you arrive at the hospital. "Nothing by mouth" means no liquids, gum, candy, etc for one hour before your arrival time.      Laparoscopy Laparoscopy is a procedure to diagnose diseases in the abdomen. During the procedure, a thin, lighted, pencil-sized instrument called a laparoscope is inserted into the abdomen through an incision. The laparoscope allows your health care provider to look at the organs inside your body. LET [facility] CARE PROVIDER KNOW ABOUT:  Any allergies you have.  All medicines you are taking, including vitamins, herbs, eye drops, creams, and over-the-counter medicines.  Previous problems you or members of your family have had with the use of  anesthetics.  Any blood disorders you have.  Previous surgeries you have had.  Medical conditions you have. RISKS AND COMPLICATIONS  Generally, this is a safe procedure. However, problems can occur, which may include:  Infection.  Bleeding.  Damage to other organs.  Allergic reaction to the anesthetics used during the procedure. BEFORE THE PROCEDURE  Do not eat or drink anything after midnight on the night before the procedure or as directed by your health care provider.  Ask your health care provider about: ? Changing or stopping your regular medicines. ? Taking medicines such as aspirin and ibuprofen. These medicines can thin your blood. Do not take these medicines before your procedure if your health care provider instructs you not to.  Plan to have someone take you home after the procedure. PROCEDURE  You may be given a medicine to help you relax (sedative).  You will be given a medicine to make you sleep (general anesthetic).  Your abdomen will be inflated with a gas. This will make your organs easier to see.  Small incisions will be made in your abdomen.  A laparoscope and other small instruments will be inserted into the abdomen through the incisions.  A tissue sample may be removed from an organ in the abdomen for examination.  The instruments will be removed from the abdomen.  The gas will be released.  The incisions will be closed with stitches (sutures). AFTER THE PROCEDURE  Your blood pressure, heart rate, breathing rate, and blood oxygen level will be monitored often until the medicines you were given have worn off.   This information is not intended to replace advice given to you by your health care provider. Make sure you discuss any questions you have with your health care provider.                                             Bowel Symptoms After Surgery After gynecologic surgery, women often have temporary changes in bowel function (constipation  and gas pain).  Following are tips to help prevent and treat common bowel problems.  It also tells you when to call the doctor.  This is important because some symptoms might be a sign of a  more serious bowel problem such as obstruction (bowel blockage).  These problems are rare but can happen after gynecologic surgery.   Besides surgery, what can temporarily affect bowel function? 1. Dietary changes   2. Decreased physical activity   3.Antibiotics   4. Pain medication   How can I prevent constipation (three days or more without a stool)? 1. Include fiber in your diet: whole grains, raw or dried fruits & vegetables, prunes, prune/pear juiceDrink at least 8 glasses of liquid (preferably water) every day 2. Avoid: ? Gas forming foods such as broccoli, beans, peas, salads, cabbage, sweet potatoes ? Greasy, fatty, or fried foods 3. Activity helps bowel function return to normal, walk around the house at least 3-4 times each day for 15 minutes or longer, if tolerated.  Rocking in a rocking chair is preferable to sitting still. 4. Stool softeners: these are not laxatives, but serve to soften the stool to avoid straining.  Take 2-4 times a day until normal bowel function returns         Examples: Colace or generic equivalent (Docusate) 5. Bulk laxatives: provide a concentrated source of fiber.  They do not stimulate the bowel.  Take 1-2 times each day until normal bowel function return.              Examples: Citrucel, Metamucil, Fiberal, Fibercon   What can I take for "Gas Pains"? 1. Simethicone (Mylicon, Gas-X, Maalox-Gas, Mylanta-Gas) take 3-4 times a day 2. Maalox Regular - take 3-4 times a day 3. Mylanta Regular - take 3-4 times a day   What can I take if I become constipated? 1. Start with stool softeners and add additional laxatives below as needed to have a bowel movement every 1-2 days  2. Stool softeners 1-2 tablets, 2 times a day 3. Senokot 1-2 tablets, 1-2 times a day 4. Glycerin  suppository can soften hard stool take once a day 5. Bisacodyl suppository once a day  6. Milk of Magnesia 30 mL 1-2 times a day 7. Fleets or tap water enema    What can I do for nausea?  1. Limit most solid foods for 24-48 hours 2. Continue eating small frequent amounts of liquids and/or bland soft foods ? Toast, crackers, cooked cereal (grits, cream of wheat, rice) 3. Benadryl: a mild anti-nausea medicine can be obtained without a prescription. May cause drowsiness, especially if taken with narcotic pain medicines 4. Contact provider for prescription nausea medication     What can I do, or take for diarrhea (more than five loose stools per day)? 1. Drink plenty of clear fluids to prevent dehydration 2. May take Kaopectate, Pepto-Bismol, Imodium, or probiotics for 1-2 days 3. Anusol or Preparation-H can be helpful for hemorrhoids and irritated tissue around anus   When should I call the doctor?             CONSTIPATION:   Not relieved after three days following the above program VOMITING:  That contains blood, "coffee ground" material  More the three times/hour and unable to keep down nausea medication for more than eight hours  With dry mouth, dark or strong urine, feeling light-headed, dizzy, or confused  With severe abdominal pain or bloating for more than 24 hours DIARRHEA:  That continues for more then 24-48 hours despite treatment  That contains blood or tarry material  With dry mouth, dark or strong urine, feeling light~headed, dizzy, or confused FEVER:  101 F or higher along with nausea, vomiting, gas pain, diarrhea  UNABLE TO:  Pass gas from rectum for more than 24 hours  Tolerate liquids by mouth for more than 24 hours        Laparoscopic Hysterectomy, Care After Refer to this sheet in the next few weeks. These instructions provide you with information on caring for yourself after your procedure. Your health care provider may also give you more specific  instructions. Your treatment has been planned according to current medical practices, but problems sometimes occur. Call your health care provider if you have any problems or questions after your procedure. What can I expect after the procedure?  Pain and bruising at the incision sites. You will be given pain medicine to control it.  Menopausal symptoms such as hot flashes, night sweats, and insomnia if your ovaries were removed.  Sore throat from the breathing tube that was inserted during surgery. Follow these instructions at home:  Only take over-the-counter or prescription medicines for pain, discomfort, or fever as directed by your health care provider.  Do not take aspirin. It can cause bleeding.  Do not drive when taking pain medicine.  Follow your health care provider's advice regarding diet, exercise, lifting, driving, and general activities.  Resume your usual diet as directed and allowed.  Get plenty of rest and sleep.  Do not douche, use tampons, or have sexual intercourse for at least 6 weeks, or until your health care provider gives you permission.  Change your bandages (dressings) as directed by your health care provider.  Monitor your temperature and notify your health care provider of a fever.  Take showers instead of baths for 2-3 weeks.  Do not drink alcohol until your health care provider gives you permission.  If you develop constipation, you may take a mild laxative with your health care provider's permission. Bran foods may help with constipation problems. Drinking enough fluids to keep your urine clear or pale yellow may help as well.  Try to have someone home with you for 1-2 weeks to help around the house.  Keep all of your follow-up appointments as directed by your health care provider. Contact a health care provider if:  You have swelling, redness, or increasing pain around your incision sites.  You have pus coming from your incision.  You notice  a bad smell coming from your incision.  Your incision breaks open.  You feel dizzy or lightheaded.  You have pain or bleeding when you urinate.  You have persistent diarrhea.  You have persistent nausea and vomiting.  You have abnormal vaginal discharge.  You have a rash.  You have any type of abnormal reaction or develop an allergy to your medicine.  You have poor pain control with your prescribed medicine. Get help right away if:  You have chest pain or shortness of breath.  You have severe abdominal pain that is not relieved with pain medicine.  You have pain or swelling in your legs. This information is not intended to replace advice given to you by your health care provider. Make sure you discuss any questions you have with your health care provider. Document Released: 11/30/2012 Document Revised: 07/18/2015 Document Reviewed: 08/30/2012 Elsevier Interactive Patient Education  2017 [employer].

## 2019-11-01 NOTE — Progress Notes (Signed)
Surgery scheduled for 11/08/19 with Dr's. Secord/Staebler.Pre and post operative teaching completed. Copy of teaching provided in AVS.Provided contact information for any questions.

## 2019-11-02 ENCOUNTER — Ambulatory Visit: Payer: Medicare HMO

## 2019-11-02 ENCOUNTER — Other Ambulatory Visit: Payer: Self-pay | Admitting: Nurse Practitioner

## 2019-11-02 ENCOUNTER — Telehealth: Payer: Self-pay

## 2019-11-02 DIAGNOSIS — C541 Malignant neoplasm of endometrium: Secondary | ICD-10-CM

## 2019-11-02 NOTE — Telephone Encounter (Signed)
Returned to daughter-in-law, Metro Kung. Went over the changes in surgical plans at the request of Ms. Trenton Gammon. Surgery will be at [facility] due to her medical history. [facility] will reach out with that appointment date. She can cancel covid test here and surgery date here. Keep appointment with Dr. Fenton Malling 9/13 for cardiac clearance and 9/13 CT scan. Metro Kung will pick up CT contrast. Encouraged to call with any further questions.

## 2019-11-03 LAB — HEMOGLOBIN A1C
Hgb A1c MFr Bld: 5.7 % — ABNORMAL HIGH (ref 4.8–5.6)
Mean Plasma Glucose: 116.89 mg/dL

## 2019-11-06 ENCOUNTER — Ambulatory Visit
Admission: RE | Admit: 2019-11-06 | Discharge: 2019-11-06 | Disposition: A | Discharge status: Home / Self Care | Payer: Medicare HMO | Source: Ambulatory Visit | Attending: Nurse Practitioner | Admitting: Nurse Practitioner

## 2019-11-06 ENCOUNTER — Other Ambulatory Visit: Payer: Self-pay

## 2019-11-06 DIAGNOSIS — C541 Malignant neoplasm of endometrium: Secondary | ICD-10-CM | POA: Insufficient documentation

## 2019-11-13 ENCOUNTER — Telehealth: Payer: Self-pay

## 2019-11-13 NOTE — Telephone Encounter (Signed)
Called and notified with CT results. She has follow up with Dr. Theora Gianotti at [facility].  MPRESSION: 1. No evidence of metastatic disease within the chest, abdomen, or pelvis. 2. Known endometrial thickening is not well appreciated on this noncontrast examination. 3. Extensive coronary artery calcification. Left anterior descending coronary artery stenting has been performed.

## 2019-11-14 ENCOUNTER — Other Ambulatory Visit: Payer: Medicare HMO

## 2019-11-14 DIAGNOSIS — Z9229 Personal history of other drug therapy: Secondary | ICD-10-CM | POA: Insufficient documentation

## 2019-11-21 DIAGNOSIS — C541 Malignant neoplasm of endometrium: Secondary | ICD-10-CM | POA: Insufficient documentation

## 2019-11-22 DIAGNOSIS — I2510 Atherosclerotic heart disease of native coronary artery without angina pectoris: Secondary | ICD-10-CM | POA: Insufficient documentation

## 2019-11-30 HISTORY — PX: HYSTERECTOMY ABDOMINAL WITH SALPINGO-OOPHORECTOMY: SHX6792

## 2019-12-20 ENCOUNTER — Other Ambulatory Visit: Payer: Self-pay

## 2019-12-20 ENCOUNTER — Ambulatory Visit: Payer: Medicare HMO

## 2019-12-20 ENCOUNTER — Inpatient Hospital Stay: Payer: Medicare HMO | Attending: Obstetrics and Gynecology | Admitting: Obstetrics and Gynecology

## 2019-12-20 VITALS — BP 119/43 | HR 67 | Temp 98.0°F | Resp 20 | Wt 238.0 lb

## 2019-12-20 DIAGNOSIS — Z90722 Acquired absence of ovaries, bilateral: Secondary | ICD-10-CM | POA: Diagnosis not present

## 2019-12-20 DIAGNOSIS — R600 Localized edema: Secondary | ICD-10-CM | POA: Diagnosis not present

## 2019-12-20 DIAGNOSIS — C541 Malignant neoplasm of endometrium: Secondary | ICD-10-CM

## 2019-12-20 DIAGNOSIS — Z90710 Acquired absence of both cervix and uterus: Secondary | ICD-10-CM | POA: Insufficient documentation

## 2019-12-20 DIAGNOSIS — K5900 Constipation, unspecified: Secondary | ICD-10-CM | POA: Diagnosis not present

## 2019-12-20 DIAGNOSIS — Z7189 Other specified counseling: Secondary | ICD-10-CM

## 2019-12-20 NOTE — Patient Instructions (Signed)
For your dry skin, you can try topical Aveeno or Cerave for hydration. Apply as needed. Please let me know if you have questions or concerns. Beckey Rutter, NP

## 2019-12-20 NOTE — Progress Notes (Addendum)
Gynecologic Oncology Interval Visit   Referring Provider: Malachy Mood, MD [address] [place],  [place] [address] [phone]  Chief Concern: high-grade endometrial adenocarcinoma  Subjective:  Bridget Murphy is a 74 y.o. female who is seen in consultation from Dr. Georgianne Fick for high-grade endometrial adenocarcinoma.  Preop CT CA/P 11/06/2019:  IMPRESSION: 1. No evidence of metastatic disease within the chest, abdomen, or pelvis. 2. Known endometrial thickening is not well appreciated on this noncontrast examination. 3. Extensive coronary artery calcification. Left anterior descending coronary artery stenting has been performed.  On 11/30/2019 she underwent Exam under anesthesia, Diagnostic laparoscopy, Robot-assisted total laparoscopic hysterectomy, Bilateral salpingo-oophorectomy, Sentinel lymph node mapping, Bilateral pelvic sentinel lymph node biopsy, and Omental biopsy.  DIAGNOSIS  A.  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 Negative for lymphatic vascular space invasion.  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  Right ovary and fallopian tube: Negative for malignancy. Left ovary and fallopian tube: Negative for malignancy.  Microsatellite instability and mismatch repair protein immunohistochemistry: Pending, paraffin block A2, to be reported separately from the molecular diagnostics laboratory.  See synoptic report below.   B.  Right obturator, primary sentinel node, biopsy: One lymph node, negative for malignancy (0/1).   C.  Left external iliac artery sentinel lymph node, biopsy: One lymph node, negative for malignancy (0/1).   D.  Left obturator, primary sentinel lymph node, biopsy: One lymph  node, negative for malignancy (0/1).   E.  Left large obturator lymph node, biopsy: One lymph node, negative for metastatic carcinoma (0/1).. See comment.  Comment: Because of the large size of this lymph node (4.2 cm), it will be referred to our hematopathology division for additional review.   An addendum will follow.   F.  Left internal iliac, biopsy: One lymph node, negative for malignancy (0/1).   G.  Omentum, omentectomy: Omentum, negative for malignancy.  Electronically signed by Rush Barer, MD on 12/11/2019 at 1559  Addendum 2  This part of hematopathology consultation (specimen E) has been reviewed in our weekly hematopathology QA conference, attended by Drs. Alvera Novel, ASL, JN, SW and EC besides me, and the interpretation and diagnosis rendered above reflect the consensus of the discussion among the attendee.  Addendum electronically signed by Lynnae Sandhoff, MD on 12/18/2019 at [place]  Regarding the enlarged lymph node (left obturator, specimen E), microscopic examination reveals profiles of lymph node with somewhat preserved normal nodal architecture. The cellular constituents appear somewhat disorganized, with slightly expanded sinuses and abundant pink fibrillar hyaline material (with focal calcification) scattered throughout the node.  A panel of immunohistochemical stains was performed after review of H&E slides, on block E2, to assess the lymph node, with appropriate positive and negative controls:  IHC Result  CD3/CD20 Highlights small mature B-cells (follicles) and T-cells in appropriate distributions.  BCL2 Negative in germinal centers. Positive in scattered paracortical cells.  BCL6 Highlights germinal centers.  Ki67 Appropriately elevated in germinal centers, which appear well-defined with retained polarity. Otherwise very low proliferation index.  Kappa/Lambda Light chain expression appears polytypic.  S100 Essentially  negative.  CD68 Highlights increased sinus histiocytes.   Taken together, the morphologic and immunophenotypic findings are consistent with reactive follicular hyperplasia with sinus histiocytosis. There is no histopathologic evidence of lymphoproliferative disorder. The etiology  of the abundant sclerotic change with focal calcification is unclear.   [place]: Diagnostic Interpretation A ATYPICAL/INCONCLUSIVE. Marland Kitchen Rare atypical cells, too few to classify, see COMMENT. COMMENT: The cell block is paucicellular and does not contain the cells of interest, precluding further evaluation.  DMMR/MSI status pending.   Tumor Board recommendations:  Recommendations: WPRT + VBT vs 3 cycles carboplatin and paclitaxel +VBT (GOG249 regimen), vs EBRT/cisplatin + 4 cycles carboplatin and paclitaxel (PORTEC3 regimen) vs consider VBT alone (not first choice due to grade, tumor size, deep invasion, and cervical stromal invasion)    Gynecologic Oncology History Bridget Murphy is a pleasant female who is seen in consultation from Dr. Georgianne Fick for high-grade endometrial adenocarcinoma and postmenopausal bleeding. Please see prior notes for complete details.  Pelvic US 10/23/2019 The uterus is anteverted and measures 11.3 x 7.0 x 6.0 cm. Echo texture is heterogenous with evidence of focal masses. Within the uterus There are possibly submucosal fibroids vs. Polyps measuring 25.2 x 21.6 x 31.3 mm, 16.7 x 8.3 x 13.8 mm, 12.9 x 8.9 x 12.3 mm. There is also a thick endometrium. The lining in the cervical canal is thick also.   The Endometrium measures 29.0 mm. No IUD is seen.   Right Ovary measures 3.2 x 2.1 x 1.7 cm. It is normal in appearance. There is a follicle in the right ovary measuring 12.0 x 7.9 x 11.0 mm Left Ovary measures 2.5 x 2.2 x 1.0 cm. It is normal in appearance. Survey of the adnexa demonstrates no adnexal masses. There is no free fluid in the cul de sac.  Impression: 1. There are possible  fibroids and or polyps within the endometrium canal.  2. The endometrial lining appears thick, as well as, the cervical canal lining.  3. The ovaries appear normal.  4. No IUD is seen.    10/23/2019 Endometrial biopsy performed   A. ENDOMETRIUM, BIOPSY:  - High-grade endometrial adenocarcinoma, FIGO grade 3.  - The carcinoma has mostly endometrioid features.   She also has significant medical history with h/o MI, CHF, COPD, stage 3 chronic kidney disease, and elevated BMI.    Problem List: Patient Active Problem List   Diagnosis Date Noted  . Endometrial adenocarcinoma ([place]) 11/01/2019  . Bilateral chronic knee pain 04/04/2019  . Ds DNA antibody positive 04/04/2019  . Lymphedema of both lower extremities 04/04/2019  . Hyperparathyroidism due to renal insufficiency ([place]) 02/19/2019  . Acute gout involving toe 09/28/2016  . Systemic lupus erythematosus ([place]) 09/28/2016  . Chronic diastolic CHF (congestive heart failure) ([place]) 09/11/2015  . Stage 3 chronic kidney disease ([place]) 03/11/2015  . Acute myocardial infarction, unspecified ([place]) 10/04/2013  . Essential hypertension 10/04/2013  . Hyperlipidemia 10/04/2013  . Morbid obesity ([place]) 10/04/2013  . Other specified postprocedural states 10/04/2013    Past Medical History: Past Medical History:  Diagnosis Date  . Acute myocardial infarction [facility])    s/p three stents; anterior STEMI, DES LAD 09/25/2010  . Arthritis   . Chronic kidney disease   . COPD (chronic obstructive pulmonary disease) ([place])   . Hyperlipidemia   . Hypertension     Past Surgical History: Past Surgical History:  Procedure Laterality Date  . HYSTERECTOMY ABDOMINAL WITH SALPINGO-OOPHORECTOMY  11/30/2019    Exam under anesthesia, Diagnostic laparoscopy, Robot-assisted total laparoscopic hysterectomy, Bilateral salpingo-oophorectomy, Sentinel lymph node mapping, Bilateral pelvic sentinel lymph node biopsy, and Omental biopsy.    Past Gynecologic History:   As per HPI  OB History:  OB History  Gravida Para  Term Preterm AB Living  1 1          SAB TAB Ectopic Multiple Live Births               # Outcome Date GA Lbr Len/2nd Weight Sex Delivery Anes PTL Lv  1 Para             Obstetric Comments  SVD x 1    Family History: Family History  Problem Relation Age of Onset  . Cancer Mother   . Cancer Sister   . Cancer Brother    Immunization History  Administered Date(s) Administered  . Moderna SARS-COVID-2 Vaccination 05/31/2019, 06/24/2019   Social History: Social History   Socioeconomic History  . Marital status: Widowed    Spouse name: Not on file  . Number of children: Not on file  . Years of education: Not on file  . Highest education level: Not on file  Occupational History  . Not on file  Tobacco Use  . Smoking status: Never Smoker  . Smokeless tobacco: Never Used  Vaping Use  . Vaping Use: Never used  Substance and Sexual Activity  . Alcohol use: Not Currently  . Drug use: Not on file  . Sexual activity: Not on file  Other Topics Concern  . Not on file  Social History Narrative  . Not on file   Social Determinants of Health   Financial Resource Strain:   . Difficulty of Paying Living Expenses: Not on file  Food Insecurity:   . Worried About Charity fundraiser in the Last Year: Not on file  . Ran Out of Food in the Last Year: Not on file  Transportation Needs:   . Lack of Transportation (Medical): Not on file  . Lack of Transportation (Non-Medical): Not on file  Physical Activity:   . Days of Exercise per Week: Not on file  . Minutes of Exercise per Session: Not on file  Stress:   . Feeling of Stress : Not on file  Social Connections:   . Frequency of Communication with Friends and Family: Not on file  . Frequency of Social Gatherings with Friends and Family: Not on file  . Attends Religious Services: Not on file  . Active Member of Clubs or Organizations: Not on file  . Attends Theatre manager Meetings: Not on file  . Marital Status: Not on file  Intimate Partner Violence:   . Fear of Current or Ex-Partner: Not on file  . Emotionally Abused: Not on file  . Physically Abused: Not on file  . Sexually Abused: Not on file    Allergies: Allergies  Allergen Reactions  . Furosemide Rash    Current Medications: Current Outpatient Medications  Medication Sig Dispense Refill  . allopurinol (ZYLOPRIM) 100 MG tablet Take by mouth.    Marland Kitchen aspirin 81 MG EC tablet Take by mouth.    Marland Kitchen atorvastatin (LIPITOR) 40 MG tablet Take by mouth.    . clopidogrel (PLAVIX) 75 MG tablet     . diclofenac Sodium (VOLTAREN) 1 % GEL Apply topically.    . enoxaparin (LOVENOX) 40 MG/0.4ML injection Inject into the skin.    . furosemide (LASIX) 20 MG tablet     . ibuprofen (ADVIL) 100 MG/5ML suspension Take 200 mg by mouth every 4 (four) hours as needed.    Marland Kitchen lisinopril-hydrochlorothiazide (ZESTORETIC) 10-12.5 MG tablet Take by mouth.    . metoprolol succinate (TOPROL-XL) 50 MG 24 hr tablet Take by mouth.    Marland Kitchen  nitroGLYCERIN (NITROSTAT) 0.4 MG SL tablet     . polyethylene glycol powder (GLYCOLAX/MIRALAX) 17 GM/SCOOP powder Take by mouth.    . senna-docusate (SENOKOT-S) 8.6-50 MG tablet Take by mouth.    . calcium elemental as carbonate (TUMS ULTRA 1000) 400 MG chewable tablet Chew by mouth. (Patient not taking: Reported on 11/01/2019)     No current facility-administered medications for this visit.    Review of Systems General: no complaints  HEENT: no complaints  Lungs: no complaints  Cardiac: no complaints  GI: abdominal bloating but tolerating oral intake without difficulty and no n/v. She does have constipation  GU: vaginal discharge/spotting  Musculoskeletal: no complaints  Extremities: leg swelling  Skin: rash  Neuro: no complaints  Endocrine: no complaints  Psych: no complaints      Objective:  Physical Examination:  BP (!) 119/43   Pulse 67   Temp 98 F (36.7 C)   Resp  20   Wt 238 lb (108 kg)   SpO2 100%   BMI 38.41 kg/m    ECOG Performance Status: 1 - Symptomatic but completely ambulatory  GENERAL: Patient is a well appearing female in no acute distress HEENT:  PERRL, neck supple with midline trachea. Thyroid without masses.  NODES:  No cervical, supraclavicular, axillary, or inguinal lymphadenopathy palpated.  LUNGS:  Clear to auscultation bilaterally.  No wheezes or rhonchi. HEART:  Regular rate and rhythm. No murmur appreciated. ABDOMEN:  Soft, nontender, nondistended. No masses, hernias, or ascites. Incisions healing nicely. The incision on the left was cleansed and reinforced with steri strips.  MSK:  No focal spinal tenderness to palpation. Full range of motion bilaterally in the upper extremities. EXTREMITIES:  Symmetrical edema.  SKIN:  Clear with no obvious rashes or skin changes. But she does have dry flaking skin on her back.  NEURO:  Nonfocal. Well oriented.  Appropriate affect.  Pelvic: Chaperoned by nursing.  EGBUS: no lesions Cervix:  surgically absent Vagina: no lesions, vaginal cuff healing, mild yellowish/white discharge; no vaginal bleeding Uterus: surgically absent BME and Rectovaginal: deferred given recent surgery   Lab Review Lab Results  Component Value Date   WBC 8.2 11/01/2019   HGB 10.3 (L) 11/01/2019   HCT 32.8 (L) 11/01/2019   MCV 84.5 11/01/2019   PLT 215 11/01/2019     Chemistry      Component Value Date/Time   NA 140 11/01/2019 1136   K 4.4 11/01/2019 1136   CL 105 11/01/2019 1136   CO2 25 11/01/2019 1136   BUN 24 (H) 11/01/2019 1136   CREATININE 1.18 (H) 11/01/2019 1136      Component Value Date/Time   CALCIUM 9.1 11/01/2019 1136   ALKPHOS 76 11/01/2019 1136   AST 18 11/01/2019 1136   ALT 9 11/01/2019 1136   BILITOT 0.7 11/01/2019 1136      See CareEverywhere for more recent labs  Radiologic Imaging: As noted    Assessment:  Bridget Murphy is a 74 y.o. female diagnosed with stage II grade2  endometrioid endometrial cancer.   Recovering from surgery well. Abdominal bloating most likely from constipation. Abdominal exam benign.   Leg swelling appears symmetrical, continue postop lovenox  Dermatologic symptoms  Medical co-morbidities complicating care: HTN and obesity, prior MI s/p 3 stents on Plavix, h/o CHF, chronic kidney disease, and COPD.  Plan:   Problem List Items Addressed This Visit      Genitourinary   Endometrial adenocarcinoma [facility])   Relevant Orders   Ambulatory referral to Radiation  Oncology   Ambulatory referral to Oncology    Other Visit Diagnoses    Counseling and coordination of care    -  Primary     We discussed options for management for adjuvant therapy. Recommended Radiation Oncology and Medical Oncology consultations. At tumor board we also discussed WPRT + VBT + 3 cycles of chemotherapy which several providers including Dr. Christel Mormon, Radiation Oncology agreed. If p53 positive may want to extend to 6 cycles of chemotherapy if able to tolerate.   DMMR/MSI results pending. Continue to follow up.  Discussion of symptom management regarding constipation was discussed in detail including: - Senna 8.46m, 2-12 per day, divided BID - Add miralax every other day as needed on top of the senna - Exercise as able - Fluid as able - If you have diarrhea, stop or reduce stool softeners.  - If despite taking stool softeners, and you still have no bowel movement for 2 days or more than your normal bowel habit frequency, please take one of the following over the counter laxatives:  Milk of Magnesia or Mag Citrate everyday and contact me immediately for further instructions.  The goal is to have at least one bowel movement every day or every other day without pain or straining.   Continue lovenox for full 28 days.   Topical lotion for skin dryness.   Suggested return to clinic in ~6 weeks for vaginal cuff check and then again in 3 months for surveillance exam. At  that time will discuss surveillance plan.   The patient's diagnosis, an outline of the further diagnostic and laboratory studies which will be required, the recommendation, and alternatives were discussed.  All questions were answered to the patient's satisfaction.  A total of 30 minutes were spent with the patient/family today; >50% was spent in education, counseling and coordination of care for endometrial cancer and postoperative adjuvant therapy.     [place] AGaetana Michaelis MD    CC:  AMalachy Mood MD [address]  [place] [phone]

## 2019-12-21 ENCOUNTER — Telehealth: Payer: Self-pay

## 2019-12-21 NOTE — Telephone Encounter (Signed)
Called and went over medical and radiation oncology appointments. Provided education for constipation per below.  Discussion of symptom management regarding constipation was discussed in detail including:  Senna 8.6mg , 2-12 per day, divided BID  Add miralax every other day as needed on top of the senna  Exercise as able  Fluid as able  If you have diarrhea, stop or reduce stool softeners.  If despite taking stool softeners, and you still have no bowel movement for 2 days or more than your normal bowel habit frequency, please take one of the following over the counter laxatives: Milk of Magnesia or Mag Citrate everyday and contact me immediately for further instructions. The goal is to have at least one bowel movement every day or every other day without pain or straining.

## 2019-12-25 ENCOUNTER — Other Ambulatory Visit: Payer: Self-pay

## 2019-12-25 ENCOUNTER — Ambulatory Visit
Admission: RE | Admit: 2019-12-25 | Discharge: 2019-12-25 | Disposition: A | Discharge status: Home / Self Care | Payer: Medicare HMO | Source: Ambulatory Visit | Attending: Radiation Oncology | Admitting: Radiation Oncology

## 2019-12-25 ENCOUNTER — Encounter: Payer: Self-pay | Admitting: Radiation Oncology

## 2019-12-25 ENCOUNTER — Inpatient Hospital Stay: Payer: Medicare HMO

## 2019-12-25 ENCOUNTER — Inpatient Hospital Stay: Payer: Medicare HMO | Attending: Oncology | Admitting: Oncology

## 2019-12-25 ENCOUNTER — Encounter: Payer: Self-pay | Admitting: Oncology

## 2019-12-25 VITALS — BP 125/57 | HR 57 | Temp 97.0°F | Wt 238.1 lb

## 2019-12-25 VITALS — BP 125/57 | HR 57 | Temp 97.0°F | Resp 18 | Ht 66.0 in | Wt 238.1 lb

## 2019-12-25 DIAGNOSIS — N189 Chronic kidney disease, unspecified: Secondary | ICD-10-CM | POA: Diagnosis not present

## 2019-12-25 DIAGNOSIS — Z791 Long term (current) use of non-steroidal anti-inflammatories (NSAID): Secondary | ICD-10-CM | POA: Insufficient documentation

## 2019-12-25 DIAGNOSIS — Z79899 Other long term (current) drug therapy: Secondary | ICD-10-CM | POA: Insufficient documentation

## 2019-12-25 DIAGNOSIS — Z923 Personal history of irradiation: Secondary | ICD-10-CM | POA: Insufficient documentation

## 2019-12-25 DIAGNOSIS — Z803 Family history of malignant neoplasm of breast: Secondary | ICD-10-CM | POA: Insufficient documentation

## 2019-12-25 DIAGNOSIS — J449 Chronic obstructive pulmonary disease, unspecified: Secondary | ICD-10-CM | POA: Diagnosis not present

## 2019-12-25 DIAGNOSIS — Z90722 Acquired absence of ovaries, bilateral: Secondary | ICD-10-CM | POA: Diagnosis not present

## 2019-12-25 DIAGNOSIS — Z87891 Personal history of nicotine dependence: Secondary | ICD-10-CM | POA: Insufficient documentation

## 2019-12-25 DIAGNOSIS — Z90710 Acquired absence of both cervix and uterus: Secondary | ICD-10-CM | POA: Insufficient documentation

## 2019-12-25 DIAGNOSIS — K5900 Constipation, unspecified: Secondary | ICD-10-CM | POA: Insufficient documentation

## 2019-12-25 DIAGNOSIS — I2510 Atherosclerotic heart disease of native coronary artery without angina pectoris: Secondary | ICD-10-CM | POA: Diagnosis not present

## 2019-12-25 DIAGNOSIS — C541 Malignant neoplasm of endometrium: Secondary | ICD-10-CM | POA: Insufficient documentation

## 2019-12-25 DIAGNOSIS — E785 Hyperlipidemia, unspecified: Secondary | ICD-10-CM | POA: Diagnosis not present

## 2019-12-25 DIAGNOSIS — Z8041 Family history of malignant neoplasm of ovary: Secondary | ICD-10-CM | POA: Diagnosis not present

## 2019-12-25 DIAGNOSIS — I10 Essential (primary) hypertension: Secondary | ICD-10-CM | POA: Insufficient documentation

## 2019-12-25 DIAGNOSIS — Z7982 Long term (current) use of aspirin: Secondary | ICD-10-CM | POA: Insufficient documentation

## 2019-12-25 DIAGNOSIS — E669 Obesity, unspecified: Secondary | ICD-10-CM | POA: Insufficient documentation

## 2019-12-25 DIAGNOSIS — I252 Old myocardial infarction: Secondary | ICD-10-CM | POA: Insufficient documentation

## 2019-12-25 DIAGNOSIS — M129 Arthropathy, unspecified: Secondary | ICD-10-CM | POA: Diagnosis not present

## 2019-12-25 NOTE — Progress Notes (Signed)
Hematology/Oncology Consult note [facility] [phone] [phone]   Patient Care Team: Lynnell Jude, MD as PCP - General (Family Medicine) Clent Jacks, RN as Oncology Nurse Navigator  REFERRING PROVIDER: Theora Gianotti, [place] Philbert Riser*  CHIEF COMPLAINTS/REASON FOR VISIT:  Evaluation of endometrial cancer  HISTORY OF PRESENTING ILLNESS:   Bridget Murphy is a  74 y.o.  female with PMH listed below was seen in consultation at the request of  Gillis Ends*  for evaluation of endometrial cancer  Postmenopausal bleeding.   US pelvis 10/23/2019 The uterus is anteverted and measures 11.3 x 7.0 x 6.0 cm. Echo texture is heterogenous with evidence of focal masses. Within the uterus There are possibly submucosal fibroids vs. Polyps measuring 25.2 x 21.6 x 31.3 mm, 16.7 x 8.3 x 13.8 mm, 12.9 x 8.9 x 12.3 mm. There is also a thick endometrium. The lining in the cervical canal is thick also.  The Endometrium measures 29.0 mm. No IUD is seen.  Right Ovary measures 3.2 x 2.1 x 1.7 cm. It is normal in appearance. There is a follicle in the right ovary measuring 12.0 x 7.9 x 11.0 mm Left Ovary measures 2.5 x 2.2 x 1.0 cm. It is normal in appearance. Survey of the adnexa demonstrates no adnexal masses. There is no free fluid in the cul de sac.  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   11/06/2019 CT chest abdomen and pelvis 1. No evidence of metastatic disease within the chest, abdomen, or pelvis. 2. Known endometrial thickening is not well appreciated on this noncontrast examination. 3. Extensive coronary artery calcification. Left anterior descending coronary artery stenting has been performed. Aortic Atherosclerosis (ICD10-I70.0).  11/30/2019 She underwent Robot-assisted total laparoscopic hysterectomy, Bilateral salpingo-oophorectomy, Sentinel lymph node mapping, Bilateral pelvic sentinel  lymph node biopsy, and Omental biopsy  A.  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 Negative for lymphatic vascular space invasion. 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 Right ovary and fallopian tube: Negative for malignancy. Left ovary and fallopian tube: Negative for malignancy. Microsatellite instability and mismatch repair protein immunohistochemistry: Pending, paraffin block A2, to be reported separately from the molecular diagnostics laboratory  B.  Right obturator, primary sentinel node, biopsy: One lymph node, negative for malignancy (0/1). C.  Left external iliac artery sentinel lymph node, biopsy: One lymph node, negative for malignancy (0/1). D.  Left obturator, primary sentinel lymph node, biopsy: One lymph node, negative for malignancy (0/1).  E.  Left large obturator lymph node, biopsy: One lymph node, negative for metastatic carcinoma (0/1).. See comment.Comment: Because of the large size of this lymph node (4.2 cm), it will be referred to our hematopathology division for additional review.   An addendum will follow. F.  Left internal iliac, biopsy: One lymph node, negative for malignancy (0/1). G.  Omentum, omentectomy: Omentum, negative for malignancy. #Addendum Regarding the enlarged lymph node (left obturator, specimen E), microscopic examination reveals profiles of lymph node with somewhat preserved normal nodal architecture. The cellular constituents appear somewhat disorganized, with slightly expanded sinuses and abundant pink fibrillar hyaline material (with focal calcification) scattered throughout the node. A panel of immunohistochemical stains was performed after review of H&E slides, on block E2, to  assess the lymph node, with appropriate positive  and negative controls IHC Result CD3/CD20 Highlights small mature B-cells (follicles) and T-cells in appropriate distributions. BCL2 Negative in germinal centers. Positive in scattered paracortical cells. BCL6 Highlights germinal centers. Ki67 Appropriately elevated in germinal centers, which appear well-defined with retained polarity. Otherwise very low proliferation index. Kappa/Lambda Light chain expression appears polytypic. S100 Essentially negative. CD68 Highlights increased sinus histiocytes Taken together, the morphologic and immunophenotypic findings are consistent with reactive follicular hyperplasia with sinus histiocytosis. There is no histopathologic evidence of lymphoproliferative disorder. The etiology of the abundant sclerotic change with focal calcification is unclear.  Synoptic report  TUMOR   Tumor Site:  Endometrium   Histologic Type:  Endometrioid carcinoma, NOS   Histologic Grade:  FIGO grade 2   Tumor Extent:      Myometrial Invasion:  Present     Depth of Invasion in Millimeters (mm):  17 Millimeters (mm)     Myometrial Thickness in Millimeters (mm):  21 Millimeters (mm)    Adenomyosis:  Present, uninvolved by carcinoma    Uterine Serosa Involvement:  Not identified    Lower Uterine Segment Involvement:  Present LYMPH NODES   Regional Lymph Nodes:      :  All lymph nodes negative for tumor cells    Total Number of Pelvic Nodes Examined:  5    Number of Pelvic Sentinel Nodes Examined:  3    Total Number of Para-aortic Nodes Examined:  0    Number of Para-aortic Sentinel Nodes Examined:  0  PATHOLOGIC STAGE CLASSIFICATION (pTNM, AJCC 8th Edition)   Primary Tumor (pT):  pT2   Regional Lymph Nodes (pN):      Modifier:  (sn)    Category (pN):  pN0  FIGO STAGE   FIGO Stage:  II  Mismatch repair:  INTACT A. Immunohistochemistry: Normal result. Expression of MLH1, MSH2, MSH6, and PMS2 is retained in the neoplastic cells. B. PCR: Microsatellite stable (MSS)   Patient's case was presented at [facility] tumor board. Recommendation as following WPRT + VBT vs 3 cycles carboplatin and paclitaxel +VBT (GOG249 regimen), vs EBRT/cisplatin + 4 cycles carboplatin and paclitaxel (PORTEC3 regimen) vs consider VBT alone (not first choice due to grade, tumor size, deep invasion, and cervical stromal invasion)    She was seen by Dr.Secord on 12/20/2019 and was recommended for adjuvant tumor board we also discussed WPRT + VBT + 3 cycles of chemotherapy  Patient presents today to discuss about chemotherapy. She has also been seen by Dr.Chyrstal for radiation.  + constipation,  Chronic bilateral knee pain and some chronic leg swelling. Appetite is fairs, recovering from since surgery. NO SOB, chest pain, abdominal pain. Her activity is mostly limited due to bilateral arthritic pain.    Review of Systems  Constitutional: Positive for fatigue. Negative for appetite change, chills and fever.  HENT:   Negative for hearing loss and voice change.   Eyes: Negative for eye problems.  Respiratory: Negative for chest tightness and cough.   Cardiovascular: Negative for chest pain.  Gastrointestinal: Positive for constipation. Negative for abdominal distention, abdominal pain and blood in stool.  Endocrine: Negative for hot flashes.  Genitourinary: Negative for difficulty urinating and frequency.   Musculoskeletal: Positive for arthralgias.  Skin: Negative for itching and rash.  Neurological: Negative for extremity weakness.  Hematological: Negative for adenopathy.  Psychiatric/Behavioral: Negative for confusion.    MEDICAL HISTORY:  Past Medical History:  Diagnosis Date  . Acute myocardial infarction [facility])    s/p three stents; anterior STEMI, DES LAD  09/25/2010  . Arthritis   . Chronic kidney disease    . COPD (chronic obstructive pulmonary disease) ([place])   . Hyperlipidemia   . Hypertension     SURGICAL HISTORY: Past Surgical History:  Procedure Laterality Date  . HYSTERECTOMY ABDOMINAL WITH SALPINGO-OOPHORECTOMY  11/30/2019    Exam under anesthesia, Diagnostic laparoscopy, Robot-assisted total laparoscopic hysterectomy, Bilateral salpingo-oophorectomy, Sentinel lymph node mapping, Bilateral pelvic sentinel lymph node biopsy, and Omental biopsy.    SOCIAL HISTORY: Social History   Socioeconomic History  . Marital status: Widowed    Spouse name: Not on file  . Number of children: Not on file  . Years of education: Not on file  . Highest education level: Not on file  Occupational History  . Not on file  Tobacco Use  . Smoking status: Former Smoker    Packs/day: 0.50    Quit date: 12/25/2010    Years since quitting: 9.0  . Smokeless tobacco: Never Used  Vaping Use  . Vaping Use: Never used  Substance and Sexual Activity  . Alcohol use: Not Currently  . Drug use: Never  . Sexual activity: Not on file  Other Topics Concern  . Not on file  Social History Narrative  . Not on file   Social Determinants of Health   Financial Resource Strain:   . Difficulty of Paying Living Expenses: Not on file  Food Insecurity:   . Worried About Charity fundraiser in the Last Year: Not on file  . Ran Out of Food in the Last Year: Not on file  Transportation Needs:   . Lack of Transportation (Medical): Not on file  . Lack of Transportation (Non-Medical): Not on file  Physical Activity:   . Days of Exercise per Week: Not on file  . Minutes of Exercise per Session: Not on file  Stress:   . Feeling of Stress : Not on file  Social Connections:   . Frequency of Communication with Friends and Family: Not on file  . Frequency of Social Gatherings with Friends and Family: Not on file  . Attends Religious Services: Not on file  . Active Member of Clubs or Organizations: Not on file  .  Attends Archivist Meetings: Not on file  . Marital Status: Not on file  Intimate Partner Violence:   . Fear of Current or Ex-Partner: Not on file  . Emotionally Abused: Not on file  . Physically Abused: Not on file  . Sexually Abused: Not on file    FAMILY HISTORY: Family History  Problem Relation Age of Onset  . Cervical cancer Mother   . Cervical cancer Sister   . Prostate cancer Brother   . Prostate cancer Brother   . Prostate cancer Brother     ALLERGIES:  is allergic to furosemide.  MEDICATIONS:  Current Outpatient Medications  Medication Sig Dispense Refill  . allopurinol (ZYLOPRIM) 100 MG tablet Take 50 mg by mouth daily.     Marland Kitchen aspirin 81 MG EC tablet Take 81 mg by mouth.     Marland Kitchen atorvastatin (LIPITOR) 40 MG tablet Take by mouth.    . calcium elemental as carbonate (TUMS ULTRA 1000) 400 MG chewable tablet Chew by mouth daily as needed.     . clopidogrel (PLAVIX) 75 MG tablet     . diclofenac Sodium (VOLTAREN) 1 % GEL Apply topically.    . enoxaparin (LOVENOX) 40 MG/0.4ML injection Inject into the skin.    . furosemide (LASIX) 20 MG tablet     .  ibuprofen (ADVIL) 100 MG/5ML suspension Take 200 mg by mouth every 4 (four) hours as needed.    Marland Kitchen lisinopril-hydrochlorothiazide (ZESTORETIC) 10-12.5 MG tablet Take by mouth.    . metoprolol succinate (TOPROL-XL) 50 MG 24 hr tablet Take by mouth.    . oxyCODONE (OXY IR/ROXICODONE) 5 MG immediate release tablet Take 5 mg by mouth every 4 (four) hours as needed.     . polyethylene glycol powder (GLYCOLAX/MIRALAX) 17 GM/SCOOP powder Take 0.5 Containers by mouth daily.     Marland Kitchen senna-docusate (SENOKOT-S) 8.6-50 MG tablet Take by mouth.    . nitroGLYCERIN (NITROSTAT) 0.4 MG SL tablet  (Patient not taking: Reported on 12/25/2019)     No current facility-administered medications for this visit.     PHYSICAL EXAMINATION: ECOG PERFORMANCE STATUS: 2 - Symptomatic, <50% confined to bed Vitals:   12/25/19 1504  BP: (!) 125/57   Pulse: (!) 57  Resp: 18  Temp: (!) 97 F (36.1 C)   Filed Weights   12/25/19 1504  Weight: 238 lb 1.6 oz (108 kg)    Physical Exam  LABORATORY DATA:  I have reviewed the data as listed Lab Results  Component Value Date   WBC 8.2 11/01/2019   HGB 10.3 (L) 11/01/2019   HCT 32.8 (L) 11/01/2019   MCV 84.5 11/01/2019   PLT 215 11/01/2019   Recent Labs    11/01/19 1136  NA 140  K 4.4  CL 105  CO2 25  GLUCOSE 108*  BUN 24*  CREATININE 1.18*  CALCIUM 9.1  GFRNONAA 45*  GFRAA 52*  PROT 7.3  ALBUMIN 3.6  AST 18  ALT 9  ALKPHOS 76  BILITOT 0.7   Iron/TIBC/Ferritin/ %Sat No results found for: IRON, TIBC, FERRITIN, IRONPCTSAT    RADIOGRAPHIC STUDIES: I have personally reviewed the radiological images as listed and agreed with the findings in the report. CT ABDOMEN PELVIS WO CONTRAST  Result Date: 11/06/2019 CLINICAL DATA:  Uterine cancer.  Initial staging examination EXAM: CT CHEST, ABDOMEN AND PELVIS WITHOUT CONTRAST TECHNIQUE: Multidetector CT imaging of the chest, abdomen and pelvis was performed following the standard protocol without IV contrast. COMPARISON:  None. FINDINGS: CT CHEST FINDINGS Cardiovascular: Extensive coronary artery calcification. Left anterior descending coronary artery stenting has been performed. Global cardiac size within normal limits. The central pulmonary arteries are of normal caliber. Mild atherosclerotic calcification within the thoracic aorta. No aortic aneurysm identified. Mediastinum/Nodes: No enlarged mediastinal or axillary lymph nodes. Thyroid gland, trachea, and esophagus demonstrate no significant findings. Lungs/Pleura: Lungs are clear. No pleural effusion or pneumothorax. Musculoskeletal: No lytic or blastic bone lesions are identified. CT ABDOMEN PELVIS FINDINGS Hepatobiliary: No focal liver abnormality is seen. No gallstones, gallbladder wall thickening, or biliary dilatation. Pancreas: Unremarkable Spleen: Unremarkable  Adrenals/Urinary Tract: Adrenal glands are unremarkable. Kidneys are normal, without renal calculi, focal lesion, or hydronephrosis. Bladder is unremarkable. Stomach/Bowel: The stomach, small bowel, large bowel, and appendix are unremarkable. No free intraperitoneal gas or fluid. Vascular/Lymphatic: No pathologic adenopathy within the abdomen and pelvis. Moderate aortoiliac atherosclerotic calcification without evidence of aneurysm. Reproductive: Known endometrial thickening is not well appreciated on this noncontrast examination. The pelvic organs are otherwise unremarkable. No adnexal masses are seen. Other: Rectum unremarkable. Musculoskeletal: Advanced degenerative changes are seen within the lumbar spine. No lytic or blastic bone lesions are seen. IMPRESSION: 1. No evidence of metastatic disease within the chest, abdomen, or pelvis. 2. Known endometrial thickening is not well appreciated on this noncontrast examination. 3. Extensive coronary artery calcification. Left anterior  descending coronary artery stenting has been performed. Aortic Atherosclerosis (ICD10-I70.0). Electronically Signed   By: Fidela Salisbury MD   On: 11/06/2019 23:45   CT CHEST WO CONTRAST  Result Date: 11/06/2019 CLINICAL DATA:  Uterine cancer.  Initial staging examination EXAM: CT CHEST, ABDOMEN AND PELVIS WITHOUT CONTRAST TECHNIQUE: Multidetector CT imaging of the chest, abdomen and pelvis was performed following the standard protocol without IV contrast. COMPARISON:  None. FINDINGS: CT CHEST FINDINGS Cardiovascular: Extensive coronary artery calcification. Left anterior descending coronary artery stenting has been performed. Global cardiac size within normal limits. The central pulmonary arteries are of normal caliber. Mild atherosclerotic calcification within the thoracic aorta. No aortic aneurysm identified. Mediastinum/Nodes: No enlarged mediastinal or axillary lymph nodes. Thyroid gland, trachea, and esophagus demonstrate no  significant findings. Lungs/Pleura: Lungs are clear. No pleural effusion or pneumothorax. Musculoskeletal: No lytic or blastic bone lesions are identified. CT ABDOMEN PELVIS FINDINGS Hepatobiliary: No focal liver abnormality is seen. No gallstones, gallbladder wall thickening, or biliary dilatation. Pancreas: Unremarkable Spleen: Unremarkable Adrenals/Urinary Tract: Adrenal glands are unremarkable. Kidneys are normal, without renal calculi, focal lesion, or hydronephrosis. Bladder is unremarkable. Stomach/Bowel: The stomach, small bowel, large bowel, and appendix are unremarkable. No free intraperitoneal gas or fluid. Vascular/Lymphatic: No pathologic adenopathy within the abdomen and pelvis. Moderate aortoiliac atherosclerotic calcification without evidence of aneurysm. Reproductive: Known endometrial thickening is not well appreciated on this noncontrast examination. The pelvic organs are otherwise unremarkable. No adnexal masses are seen. Other: Rectum unremarkable. Musculoskeletal: Advanced degenerative changes are seen within the lumbar spine. No lytic or blastic bone lesions are seen. IMPRESSION: 1. No evidence of metastatic disease within the chest, abdomen, or pelvis. 2. Known endometrial thickening is not well appreciated on this noncontrast examination. 3. Extensive coronary artery calcification. Left anterior descending coronary artery stenting has been performed. Aortic Atherosclerosis (ICD10-I70.0). Electronically Signed   By: Fidela Salisbury MD   On: 11/06/2019 23:45   US PELVIC COMPLETE WITH TRANSVAGINAL  Result Date: 10/23/2019 Patient Name: Bridget Murphy DOB: [birth year] MRN: [id number] ULTRASOUND REPORT Location: [facility] Date of Service: 10/23/2019 Indications: Postmenopausal bleeding Findings: The uterus is anteverted and measures 11.3 x 7.0 x 6.0 cm. Echo texture is heterogenous with evidence of focal masses. Within the uterus There are possibly submucosal fibroids vs. Polyps measuring 25.2 x  21.6 x 31.3 mm, 16.7 x 8.3 x 13.8 mm, 12.9 x 8.9 x 12.3 mm. There is also a thick endometrium. The lining in the cervical canal is thick also. The Endometrium measures 29.0 mm. No IUD is seen. Right Ovary measures 3.2 x 2.1 x 1.7 cm. It is normal in appearance. There is a follicle in the right ovary measuring 12.0 x 7.9 x 11.0 mm Left Ovary measures 2.5 x 2.2 x 1.0 cm. It is normal in appearance. Survey of the adnexa demonstrates no adnexal masses. There is no free fluid in the cul de sac. Impression: 1. There are possible fibroids and or polyps within the endometrium canal. 2. The endometrial lining appears thick, as well as, the cervical canal lining. 3. The ovaries appear normal. 4. No IUD is seen. Recommendations: 1.Clinical correlation with the patient's History and Physical Exam. Gweneth Dimitri, RT Images reviewed.  The endometrial lining appears thickened in the setting of postmenopausal bleeding.  There is evidence of focal lesions within the endometrium likely representing endometrial polyps. Malachy Mood, MD, [facility], [facility] 10/23/2019, 9:31 AM      ASSESSMENT & PLAN:  1. Endometrial adenocarcinoma ([place])  2. Constipation, unspecified constipation type    # FIGO Stage II Endometrial Adenocarcinoma.  Tumor 9 cm; invading 17 mm in a 21 mm myometrium, with cervical stromal invasion. No LVSI. Negative tubes, ovaries, serosa, omentum, and lymph nodes. High intermediate risk with age >84 with grade 2 histology, deep myometrial invasion MMR intact.  Treatment options include WPRT+ Vaginal brachytherapy + 3 Cycles of chemotherapy were discussed with patient. 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 Close surveillance after she finishes adjuvant radiation and if she develops recurrence, will consider chemotherapy.   Constipation- continue senokot-S All questions were answered. The patient knows to call the clinic with any problems questions or concerns.  Thank you for this kind referral and the opportunity to participate in the care of this patient. A copy of today's note is routed to referring provider    Earlie Server, MD, PhD Hematology Oncology [facility] Pager- [phone] 12/25/2019

## 2019-12-25 NOTE — Progress Notes (Signed)
Pt here to establish care for endometrial cancer. Pt reports that she had 3 brothers with prostate cancer (deceased), mother with cervical cancer (deceased) and 1 sister with cervical cancer (alive).

## 2019-12-25 NOTE — Consult Note (Signed)
NEW PATIENT EVALUATION  Name: Bridget Murphy  MRN: [id number]  Date:   12/25/2019     DOB: 10/23/1945   This 74 y.o. 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  REFERRING PHYSICIAN: Lynnell Jude, MD  CHIEF COMPLAINT:  Chief Complaint  Patient presents with  . Consult    DIAGNOSIS: The encounter diagnosis was Endometrial adenocarcinoma ([place]).   PREVIOUS INVESTIGATIONS:  Pathology report reviewed CT scans reviewed Clinical notes reviewed  HPI: Patient is a 74 year old female with multiple medical comorbidities including history of acute MI chronic renal disease COPD.  She presented with postmenopausal bleeding and initially underwent biopsy which was positive for high-grade endometrial adenocarcinoma FIGO grade 3.  Preoperative CT scan showed no evidence of metastatic disease in the chest abdomen or pelvis.  Endometrial thickening was not well appreciated on this noncontrast exam.  She went on to have a BSO robotic assisted total laparoscopic hysterectomy sentinel lymph node mapping and omental biopsy.  Final pathology showed a FIGO grade 2 of 3 endometrial adenocarcinoma with 17 mm of 21 mm myometrial invasion stromal invasion of the cervix.  It was negative for lymphovascular space invasion.  She has done well postoperatively.  She specifically denies any problems with lower urinary tract symptoms diarrhea.  Does occasionally have some mild vaginal spotting.  We presented her case at tumor conference and she is now referred to [facility] for opinion.  PLANNED TREATMENT REGIMEN: To be determined  PAST MEDICAL HISTORY:  has a past medical history of Acute myocardial infarction ([place]), Arthritis, Chronic kidney disease, COPD (chronic obstructive pulmonary disease) ([place]), Hyperlipidemia, and Hypertension.    PAST SURGICAL HISTORY:  Past Surgical History:  Procedure Laterality Date  . HYSTERECTOMY  ABDOMINAL WITH SALPINGO-OOPHORECTOMY  11/30/2019    Exam under anesthesia, Diagnostic laparoscopy, Robot-assisted total laparoscopic hysterectomy, Bilateral salpingo-oophorectomy, Sentinel lymph node mapping, Bilateral pelvic sentinel lymph node biopsy, and Omental biopsy.    FAMILY HISTORY: family history includes Cancer in her brother, mother, and sister.  SOCIAL HISTORY:  reports that she has never smoked. She has never used smokeless tobacco. She reports previous alcohol use.  ALLERGIES: Furosemide  MEDICATIONS:  Current Outpatient Medications  Medication Sig Dispense Refill  . allopurinol (ZYLOPRIM) 100 MG tablet Take by mouth.    Marland Kitchen aspirin 81 MG EC tablet Take by mouth.    Marland Kitchen atorvastatin (LIPITOR) 40 MG tablet Take by mouth.    . clopidogrel (PLAVIX) 75 MG tablet     . diclofenac Sodium (VOLTAREN) 1 % GEL Apply topically.    . enoxaparin (LOVENOX) 40 MG/0.4ML injection Inject into the skin.    . furosemide (LASIX) 20 MG tablet     . ibuprofen (ADVIL) 100 MG/5ML suspension Take 200 mg by mouth every 4 (four) hours as needed.    Marland Kitchen lisinopril-hydrochlorothiazide (ZESTORETIC) 10-12.5 MG tablet Take by mouth.    . metoprolol succinate (TOPROL-XL) 50 MG 24 hr tablet Take by mouth.    . nitroGLYCERIN (NITROSTAT) 0.4 MG SL tablet     . polyethylene glycol powder (GLYCOLAX/MIRALAX) 17 GM/SCOOP powder Take by mouth.    . senna-docusate (SENOKOT-S) 8.6-50 MG tablet Take by mouth.    . calcium elemental as carbonate (TUMS ULTRA 1000) 400 MG chewable tablet Chew by mouth. (Patient not taking: Reported on 11/01/2019)     No current facility-administered medications for this encounter.    ECOG PERFORMANCE STATUS:  0 - Asymptomatic  REVIEW OF SYSTEMS:  Patient denies any weight loss, fatigue, weakness, fever, chills or night sweats. Patient denies any loss of vision, blurred vision. Patient denies any ringing  of the ears or hearing loss. No irregular heartbeat. Patient denies heart murmur or  history of fainting. Patient denies any chest pain or pain radiating to her upper extremities. Patient denies any shortness of breath, difficulty breathing at night, cough or hemoptysis. Patient denies any swelling in the lower legs. Patient denies any nausea vomiting, vomiting of blood, or coffee ground material in the vomitus. Patient denies any stomach pain. Patient states has had normal bowel movements no significant constipation or diarrhea. Patient denies any dysuria, hematuria or significant nocturia. Patient denies any problems walking, swelling in the joints or loss of balance. Patient denies any skin changes, loss of hair or loss of weight. Patient denies any excessive worrying or anxiety or significant depression. Patient denies any problems with insomnia. Patient denies excessive thirst, polyuria, polydipsia. Patient denies any swollen glands, patient denies easy bruising or easy bleeding. Patient denies any recent infections, allergies or URI. Patient "s visual fields have not changed significantly in recent time.   PHYSICAL EXAM: BP (!) 125/57 (Cuff Size: Large)   Pulse (!) 57   Temp (!) 97 F (36.1 C)   Wt 238 lb 1.6 oz (108 kg)   BMI 38.43 kg/m  Slightly obese female in NAD she does have significant peripheral edema of her lower extremities.  Well-developed well-nourished patient in NAD. HEENT reveals PERLA, EOMI, discs not visualized.  Oral cavity is clear. No oral mucosal lesions are identified. Neck is clear without evidence of cervical or supraclavicular adenopathy. Lungs are clear to A&P. Cardiac examination is essentially unremarkable with regular rate and rhythm without murmur rub or thrill. Abdomen is benign with no organomegaly or masses noted. Motor sensory and DTR levels are equal and symmetric in the upper and lower extremities. Cranial nerves II through XII are grossly intact. Proprioception is intact. No peripheral adenopathy or edema is identified. No motor or sensory levels  are noted. Crude visual fields are within normal range.  LABORATORY DATA: Pathology report reviewed    RADIOLOGY RESULTS: CT scans chest abdomen pelvis reviewed   IMPRESSION: High intermediate risk endometrial carcinoma FIGO grade 2 with cervical stromal invasion and extensive myometrial wall invasion in 74 year old female.  PLAN: This time like to discuss the case again with GYN oncology.  Treatment options including 3 cycles of chemotherapy plus vaginal brachytherapy versus whole pelvic radiation plus vaginal brachytherapy versus chemotherapy whole pelvic radiation and vaginal brachytherapy all have been entertained as treatment options.  Based on her being a FIGO grade 2 on final pathology after TAH/BSO and her being 74 years old with significant comorbidities I like to discuss the case with GYN oncology and come up with a final treatment plan.  Patient is seeing medical oncology today and will discuss the case after her evaluation is complete.  All this was explained in detail to the patient and her daughter.  I would like to take this opportunity to thank you for allowing me to participate in the care of your patient.Noreene Filbert, MD

## 2020-01-01 ENCOUNTER — Ambulatory Visit: Payer: Medicare HMO

## 2020-01-01 DIAGNOSIS — Z510 Encounter for antineoplastic radiation therapy: Secondary | ICD-10-CM | POA: Diagnosis not present

## 2020-01-01 DIAGNOSIS — C541 Malignant neoplasm of endometrium: Secondary | ICD-10-CM | POA: Insufficient documentation

## 2020-01-03 DIAGNOSIS — C541 Malignant neoplasm of endometrium: Secondary | ICD-10-CM | POA: Diagnosis not present

## 2020-01-05 ENCOUNTER — Other Ambulatory Visit: Payer: Self-pay | Admitting: *Deleted

## 2020-01-05 DIAGNOSIS — C541 Malignant neoplasm of endometrium: Secondary | ICD-10-CM

## 2020-01-08 ENCOUNTER — Telehealth: Payer: Self-pay

## 2020-01-08 ENCOUNTER — Ambulatory Visit: Admission: RE | Admit: 2020-01-08 | Payer: Medicare HMO | Source: Ambulatory Visit

## 2020-01-08 DIAGNOSIS — C541 Malignant neoplasm of endometrium: Secondary | ICD-10-CM | POA: Diagnosis not present

## 2020-01-08 NOTE — Telephone Encounter (Signed)
Received call from [place]. [place] in need of transportation to and from daily radiation appointments. We will arrange for our transportation services to start this Thursday 11/18. She is aware they will arrive around 45 minutes prior to her appointment.

## 2020-01-09 ENCOUNTER — Ambulatory Visit
Admission: RE | Admit: 2020-01-09 | Discharge: 2020-01-09 | Disposition: A | Discharge status: Home / Self Care | Payer: Medicare HMO | Source: Ambulatory Visit | Attending: Radiation Oncology | Admitting: Radiation Oncology

## 2020-01-09 ENCOUNTER — Ambulatory Visit: Payer: Medicare HMO

## 2020-01-09 DIAGNOSIS — C541 Malignant neoplasm of endometrium: Secondary | ICD-10-CM | POA: Diagnosis not present

## 2020-01-10 ENCOUNTER — Ambulatory Visit
Admission: RE | Admit: 2020-01-10 | Discharge: 2020-01-10 | Disposition: A | Discharge status: Home / Self Care | Payer: Medicare HMO | Source: Ambulatory Visit | Attending: Radiation Oncology | Admitting: Radiation Oncology

## 2020-01-10 ENCOUNTER — Telehealth: Payer: Self-pay | Admitting: Family Medicine

## 2020-01-10 ENCOUNTER — Ambulatory Visit: Payer: Medicare HMO

## 2020-01-10 DIAGNOSIS — C541 Malignant neoplasm of endometrium: Secondary | ICD-10-CM | POA: Diagnosis not present

## 2020-01-11 ENCOUNTER — Inpatient Hospital Stay: Payer: Medicare HMO

## 2020-01-11 ENCOUNTER — Ambulatory Visit: Payer: Medicare HMO

## 2020-01-11 ENCOUNTER — Ambulatory Visit
Admission: RE | Admit: 2020-01-11 | Discharge: 2020-01-11 | Disposition: A | Discharge status: Home / Self Care | Payer: Medicare HMO | Source: Ambulatory Visit | Attending: Radiation Oncology | Admitting: Radiation Oncology

## 2020-01-11 DIAGNOSIS — C541 Malignant neoplasm of endometrium: Secondary | ICD-10-CM | POA: Diagnosis not present

## 2020-01-12 ENCOUNTER — Ambulatory Visit
Admission: RE | Admit: 2020-01-12 | Discharge: 2020-01-12 | Disposition: A | Discharge status: Home / Self Care | Payer: Medicare HMO | Source: Ambulatory Visit | Attending: Radiation Oncology | Admitting: Radiation Oncology

## 2020-01-12 ENCOUNTER — Ambulatory Visit: Payer: Medicare HMO

## 2020-01-12 ENCOUNTER — Inpatient Hospital Stay: Payer: Medicare HMO

## 2020-01-12 DIAGNOSIS — C541 Malignant neoplasm of endometrium: Secondary | ICD-10-CM | POA: Diagnosis not present

## 2020-01-15 ENCOUNTER — Inpatient Hospital Stay: Payer: Medicare HMO

## 2020-01-15 ENCOUNTER — Ambulatory Visit: Payer: Medicare HMO

## 2020-01-15 ENCOUNTER — Ambulatory Visit
Admission: RE | Admit: 2020-01-15 | Discharge: 2020-01-15 | Disposition: A | Discharge status: Home / Self Care | Payer: Medicare HMO | Source: Ambulatory Visit | Attending: Radiation Oncology | Admitting: Radiation Oncology

## 2020-01-15 ENCOUNTER — Other Ambulatory Visit: Payer: Self-pay

## 2020-01-15 DIAGNOSIS — C541 Malignant neoplasm of endometrium: Secondary | ICD-10-CM | POA: Diagnosis not present

## 2020-01-16 ENCOUNTER — Ambulatory Visit: Payer: Medicare HMO

## 2020-01-16 ENCOUNTER — Inpatient Hospital Stay: Payer: Medicare HMO

## 2020-01-16 ENCOUNTER — Ambulatory Visit
Admission: RE | Admit: 2020-01-16 | Discharge: 2020-01-16 | Disposition: A | Discharge status: Home / Self Care | Payer: Medicare HMO | Source: Ambulatory Visit | Attending: Radiation Oncology | Admitting: Radiation Oncology

## 2020-01-16 DIAGNOSIS — C541 Malignant neoplasm of endometrium: Secondary | ICD-10-CM | POA: Diagnosis not present

## 2020-01-17 ENCOUNTER — Other Ambulatory Visit: Payer: Self-pay | Admitting: *Deleted

## 2020-01-17 ENCOUNTER — Ambulatory Visit
Admission: RE | Admit: 2020-01-17 | Discharge: 2020-01-17 | Disposition: A | Discharge status: Home / Self Care | Payer: Medicare HMO | Source: Ambulatory Visit | Attending: Radiation Oncology | Admitting: Radiation Oncology

## 2020-01-17 ENCOUNTER — Ambulatory Visit: Payer: Medicare HMO

## 2020-01-17 ENCOUNTER — Other Ambulatory Visit: Payer: Self-pay

## 2020-01-17 ENCOUNTER — Inpatient Hospital Stay: Payer: Medicare HMO

## 2020-01-17 DIAGNOSIS — C541 Malignant neoplasm of endometrium: Secondary | ICD-10-CM | POA: Diagnosis not present

## 2020-01-17 LAB — CBC
HCT: 27.1 % — ABNORMAL LOW (ref 36.0–46.0)
Hemoglobin: 8.5 g/dL — ABNORMAL LOW (ref 12.0–15.0)
MCH: 26.7 pg (ref 26.0–34.0)
MCHC: 31.4 g/dL (ref 30.0–36.0)
MCV: 85.2 fL (ref 80.0–100.0)
Platelets: 202 10*3/uL (ref 150–400)
RBC: 3.18 MIL/uL — ABNORMAL LOW (ref 3.87–5.11)
RDW: 16.3 % — ABNORMAL HIGH (ref 11.5–15.5)
WBC: 6 10*3/uL (ref 4.0–10.5)
nRBC: 0 % (ref 0.0–0.2)

## 2020-01-22 ENCOUNTER — Ambulatory Visit: Payer: Medicare HMO

## 2020-01-22 ENCOUNTER — Ambulatory Visit
Admission: RE | Admit: 2020-01-22 | Discharge: 2020-01-22 | Disposition: A | Discharge status: Home / Self Care | Payer: Medicare HMO | Source: Ambulatory Visit | Attending: Radiation Oncology | Admitting: Radiation Oncology

## 2020-01-22 ENCOUNTER — Inpatient Hospital Stay: Payer: Medicare HMO

## 2020-01-22 DIAGNOSIS — C541 Malignant neoplasm of endometrium: Secondary | ICD-10-CM | POA: Diagnosis not present

## 2020-01-23 ENCOUNTER — Ambulatory Visit: Payer: Medicare HMO

## 2020-01-23 ENCOUNTER — Ambulatory Visit
Admission: RE | Admit: 2020-01-23 | Discharge: 2020-01-23 | Disposition: A | Discharge status: Home / Self Care | Payer: Medicare HMO | Source: Ambulatory Visit | Attending: Radiation Oncology | Admitting: Radiation Oncology

## 2020-01-23 ENCOUNTER — Other Ambulatory Visit: Payer: Self-pay

## 2020-01-23 ENCOUNTER — Other Ambulatory Visit: Payer: Self-pay | Admitting: Licensed Clinical Social Worker

## 2020-01-23 ENCOUNTER — Inpatient Hospital Stay: Payer: Medicare HMO

## 2020-01-23 DIAGNOSIS — C541 Malignant neoplasm of endometrium: Secondary | ICD-10-CM | POA: Diagnosis not present

## 2020-01-23 LAB — CBC
HCT: 29.2 % — ABNORMAL LOW (ref 36.0–46.0)
Hemoglobin: 8.9 g/dL — ABNORMAL LOW (ref 12.0–15.0)
MCH: 26.2 pg (ref 26.0–34.0)
MCHC: 30.5 g/dL (ref 30.0–36.0)
MCV: 85.9 fL (ref 80.0–100.0)
Platelets: 168 10*3/uL (ref 150–400)
RBC: 3.4 MIL/uL — ABNORMAL LOW (ref 3.87–5.11)
RDW: 17.1 % — ABNORMAL HIGH (ref 11.5–15.5)
WBC: 6.2 10*3/uL (ref 4.0–10.5)
nRBC: 0 % (ref 0.0–0.2)

## 2020-01-23 MED ORDER — SILVER SULFADIAZINE 1 % EX CREA: 1.0000 "application " | TOPICAL_CREAM | Freq: Every day | CUTANEOUS | 2 refills | Status: AC

## 2020-01-24 ENCOUNTER — Ambulatory Visit
Admission: RE | Admit: 2020-01-24 | Discharge: 2020-01-24 | Disposition: A | Discharge status: Home / Self Care | Payer: Medicare HMO | Source: Ambulatory Visit | Attending: Radiation Oncology | Admitting: Radiation Oncology

## 2020-01-24 ENCOUNTER — Inpatient Hospital Stay: Payer: Medicare HMO | Attending: Oncology

## 2020-01-24 ENCOUNTER — Ambulatory Visit: Payer: Medicare HMO

## 2020-01-24 DIAGNOSIS — I2510 Atherosclerotic heart disease of native coronary artery without angina pectoris: Secondary | ICD-10-CM | POA: Insufficient documentation

## 2020-01-24 DIAGNOSIS — R32 Unspecified urinary incontinence: Secondary | ICD-10-CM | POA: Insufficient documentation

## 2020-01-24 DIAGNOSIS — Z510 Encounter for antineoplastic radiation therapy: Secondary | ICD-10-CM | POA: Insufficient documentation

## 2020-01-24 DIAGNOSIS — Z90722 Acquired absence of ovaries, bilateral: Secondary | ICD-10-CM | POA: Insufficient documentation

## 2020-01-24 DIAGNOSIS — D251 Intramural leiomyoma of uterus: Secondary | ICD-10-CM | POA: Insufficient documentation

## 2020-01-24 DIAGNOSIS — Z90710 Acquired absence of both cervix and uterus: Secondary | ICD-10-CM | POA: Insufficient documentation

## 2020-01-24 DIAGNOSIS — I252 Old myocardial infarction: Secondary | ICD-10-CM | POA: Insufficient documentation

## 2020-01-24 DIAGNOSIS — R197 Diarrhea, unspecified: Secondary | ICD-10-CM | POA: Insufficient documentation

## 2020-01-24 DIAGNOSIS — N183 Chronic kidney disease, stage 3 unspecified: Secondary | ICD-10-CM | POA: Insufficient documentation

## 2020-01-24 DIAGNOSIS — R600 Localized edema: Secondary | ICD-10-CM | POA: Insufficient documentation

## 2020-01-24 DIAGNOSIS — I5032 Chronic diastolic (congestive) heart failure: Secondary | ICD-10-CM | POA: Insufficient documentation

## 2020-01-24 DIAGNOSIS — M3214 Glomerular disease in systemic lupus erythematosus: Secondary | ICD-10-CM | POA: Insufficient documentation

## 2020-01-24 DIAGNOSIS — E8809 Other disorders of plasma-protein metabolism, not elsewhere classified: Secondary | ICD-10-CM | POA: Insufficient documentation

## 2020-01-24 DIAGNOSIS — Z7982 Long term (current) use of aspirin: Secondary | ICD-10-CM | POA: Insufficient documentation

## 2020-01-24 DIAGNOSIS — N800 Endometriosis of uterus: Secondary | ICD-10-CM | POA: Insufficient documentation

## 2020-01-24 DIAGNOSIS — Z79899 Other long term (current) drug therapy: Secondary | ICD-10-CM | POA: Insufficient documentation

## 2020-01-24 DIAGNOSIS — E785 Hyperlipidemia, unspecified: Secondary | ICD-10-CM | POA: Insufficient documentation

## 2020-01-24 DIAGNOSIS — C541 Malignant neoplasm of endometrium: Secondary | ICD-10-CM | POA: Insufficient documentation

## 2020-01-24 DIAGNOSIS — I509 Heart failure, unspecified: Secondary | ICD-10-CM | POA: Insufficient documentation

## 2020-01-24 DIAGNOSIS — J449 Chronic obstructive pulmonary disease, unspecified: Secondary | ICD-10-CM | POA: Insufficient documentation

## 2020-01-24 DIAGNOSIS — M7989 Other specified soft tissue disorders: Secondary | ICD-10-CM | POA: Insufficient documentation

## 2020-01-24 DIAGNOSIS — E669 Obesity, unspecified: Secondary | ICD-10-CM | POA: Insufficient documentation

## 2020-01-24 DIAGNOSIS — Z87891 Personal history of nicotine dependence: Secondary | ICD-10-CM | POA: Insufficient documentation

## 2020-01-24 DIAGNOSIS — I130 Hypertensive heart and chronic kidney disease with heart failure and stage 1 through stage 4 chronic kidney disease, or unspecified chronic kidney disease: Secondary | ICD-10-CM | POA: Insufficient documentation

## 2020-01-25 ENCOUNTER — Other Ambulatory Visit: Payer: Self-pay

## 2020-01-25 ENCOUNTER — Ambulatory Visit: Payer: Medicare HMO

## 2020-01-25 ENCOUNTER — Inpatient Hospital Stay: Payer: Medicare HMO

## 2020-01-25 ENCOUNTER — Ambulatory Visit
Admission: RE | Admit: 2020-01-25 | Discharge: 2020-01-25 | Disposition: A | Discharge status: Home / Self Care | Payer: Medicare HMO | Source: Ambulatory Visit | Attending: Radiation Oncology | Admitting: Radiation Oncology

## 2020-01-25 DIAGNOSIS — Z510 Encounter for antineoplastic radiation therapy: Secondary | ICD-10-CM | POA: Diagnosis not present

## 2020-01-26 ENCOUNTER — Ambulatory Visit: Payer: Medicare HMO

## 2020-01-26 ENCOUNTER — Ambulatory Visit
Admission: RE | Admit: 2020-01-26 | Discharge: 2020-01-26 | Disposition: A | Discharge status: Home / Self Care | Payer: Medicare HMO | Source: Ambulatory Visit | Attending: Radiation Oncology | Admitting: Radiation Oncology

## 2020-01-26 ENCOUNTER — Inpatient Hospital Stay: Payer: Medicare HMO

## 2020-01-26 DIAGNOSIS — Z510 Encounter for antineoplastic radiation therapy: Secondary | ICD-10-CM | POA: Diagnosis not present

## 2020-01-27 ENCOUNTER — Encounter: Payer: Self-pay | Admitting: Oncology

## 2020-01-27 DIAGNOSIS — Z7189 Other specified counseling: Secondary | ICD-10-CM | POA: Insufficient documentation

## 2020-01-29 ENCOUNTER — Other Ambulatory Visit: Payer: Self-pay

## 2020-01-29 ENCOUNTER — Inpatient Hospital Stay: Payer: Medicare HMO

## 2020-01-29 ENCOUNTER — Ambulatory Visit: Payer: Medicare HMO

## 2020-01-29 ENCOUNTER — Ambulatory Visit
Admission: RE | Admit: 2020-01-29 | Discharge: 2020-01-29 | Disposition: A | Discharge status: Home / Self Care | Payer: Medicare HMO | Source: Ambulatory Visit | Attending: Radiation Oncology | Admitting: Radiation Oncology

## 2020-01-29 DIAGNOSIS — Z510 Encounter for antineoplastic radiation therapy: Secondary | ICD-10-CM | POA: Diagnosis not present

## 2020-01-30 ENCOUNTER — Inpatient Hospital Stay: Payer: Medicare HMO

## 2020-01-30 ENCOUNTER — Ambulatory Visit
Admission: RE | Admit: 2020-01-30 | Discharge: 2020-01-30 | Disposition: A | Discharge status: Home / Self Care | Payer: Medicare HMO | Source: Ambulatory Visit | Attending: Radiation Oncology | Admitting: Radiation Oncology

## 2020-01-30 ENCOUNTER — Ambulatory Visit: Payer: Medicare HMO

## 2020-01-30 ENCOUNTER — Other Ambulatory Visit: Payer: Self-pay

## 2020-01-30 DIAGNOSIS — M3214 Glomerular disease in systemic lupus erythematosus: Secondary | ICD-10-CM | POA: Diagnosis not present

## 2020-01-30 DIAGNOSIS — Z90710 Acquired absence of both cervix and uterus: Secondary | ICD-10-CM | POA: Diagnosis not present

## 2020-01-30 DIAGNOSIS — I130 Hypertensive heart and chronic kidney disease with heart failure and stage 1 through stage 4 chronic kidney disease, or unspecified chronic kidney disease: Secondary | ICD-10-CM | POA: Diagnosis not present

## 2020-01-30 DIAGNOSIS — I5032 Chronic diastolic (congestive) heart failure: Secondary | ICD-10-CM | POA: Diagnosis not present

## 2020-01-30 DIAGNOSIS — C541 Malignant neoplasm of endometrium: Secondary | ICD-10-CM | POA: Diagnosis present

## 2020-01-30 DIAGNOSIS — N183 Chronic kidney disease, stage 3 unspecified: Secondary | ICD-10-CM | POA: Diagnosis not present

## 2020-01-30 DIAGNOSIS — Z79899 Other long term (current) drug therapy: Secondary | ICD-10-CM | POA: Diagnosis not present

## 2020-01-30 DIAGNOSIS — Z87891 Personal history of nicotine dependence: Secondary | ICD-10-CM | POA: Diagnosis not present

## 2020-01-30 DIAGNOSIS — I2510 Atherosclerotic heart disease of native coronary artery without angina pectoris: Secondary | ICD-10-CM | POA: Diagnosis not present

## 2020-01-30 DIAGNOSIS — Z90722 Acquired absence of ovaries, bilateral: Secondary | ICD-10-CM | POA: Diagnosis not present

## 2020-01-30 DIAGNOSIS — M7989 Other specified soft tissue disorders: Secondary | ICD-10-CM | POA: Diagnosis not present

## 2020-01-30 DIAGNOSIS — R197 Diarrhea, unspecified: Secondary | ICD-10-CM | POA: Diagnosis not present

## 2020-01-30 DIAGNOSIS — N800 Endometriosis of uterus: Secondary | ICD-10-CM | POA: Diagnosis not present

## 2020-01-30 DIAGNOSIS — Z7982 Long term (current) use of aspirin: Secondary | ICD-10-CM | POA: Diagnosis not present

## 2020-01-30 DIAGNOSIS — E785 Hyperlipidemia, unspecified: Secondary | ICD-10-CM | POA: Diagnosis not present

## 2020-01-30 DIAGNOSIS — Z510 Encounter for antineoplastic radiation therapy: Secondary | ICD-10-CM | POA: Diagnosis not present

## 2020-01-30 DIAGNOSIS — R600 Localized edema: Secondary | ICD-10-CM | POA: Diagnosis not present

## 2020-01-30 DIAGNOSIS — I252 Old myocardial infarction: Secondary | ICD-10-CM | POA: Diagnosis not present

## 2020-01-30 DIAGNOSIS — R32 Unspecified urinary incontinence: Secondary | ICD-10-CM | POA: Diagnosis not present

## 2020-01-30 DIAGNOSIS — J449 Chronic obstructive pulmonary disease, unspecified: Secondary | ICD-10-CM | POA: Diagnosis not present

## 2020-01-30 DIAGNOSIS — E669 Obesity, unspecified: Secondary | ICD-10-CM | POA: Diagnosis not present

## 2020-01-30 DIAGNOSIS — E8809 Other disorders of plasma-protein metabolism, not elsewhere classified: Secondary | ICD-10-CM | POA: Diagnosis not present

## 2020-01-30 DIAGNOSIS — I509 Heart failure, unspecified: Secondary | ICD-10-CM | POA: Diagnosis not present

## 2020-01-30 DIAGNOSIS — D251 Intramural leiomyoma of uterus: Secondary | ICD-10-CM | POA: Diagnosis not present

## 2020-01-30 LAB — CBC
HCT: 27.4 % — ABNORMAL LOW (ref 36.0–46.0)
Hemoglobin: 8.6 g/dL — ABNORMAL LOW (ref 12.0–15.0)
MCH: 26.7 pg (ref 26.0–34.0)
MCHC: 31.4 g/dL (ref 30.0–36.0)
MCV: 85.1 fL (ref 80.0–100.0)
Platelets: 184 10*3/uL (ref 150–400)
RBC: 3.22 MIL/uL — ABNORMAL LOW (ref 3.87–5.11)
RDW: 17.2 % — ABNORMAL HIGH (ref 11.5–15.5)
WBC: 5.8 10*3/uL (ref 4.0–10.5)
nRBC: 0 % (ref 0.0–0.2)

## 2020-01-31 ENCOUNTER — Ambulatory Visit: Payer: Medicare HMO

## 2020-01-31 ENCOUNTER — Inpatient Hospital Stay: Payer: Medicare HMO

## 2020-01-31 ENCOUNTER — Ambulatory Visit
Admission: RE | Admit: 2020-01-31 | Discharge: 2020-01-31 | Disposition: A | Discharge status: Home / Self Care | Payer: Medicare HMO | Source: Ambulatory Visit | Attending: Radiation Oncology | Admitting: Radiation Oncology

## 2020-01-31 DIAGNOSIS — Z510 Encounter for antineoplastic radiation therapy: Secondary | ICD-10-CM | POA: Diagnosis not present

## 2020-02-01 ENCOUNTER — Ambulatory Visit: Payer: Medicare HMO

## 2020-02-01 ENCOUNTER — Ambulatory Visit
Admission: RE | Admit: 2020-02-01 | Discharge: 2020-02-01 | Disposition: A | Discharge status: Home / Self Care | Payer: Medicare HMO | Source: Ambulatory Visit | Attending: Radiation Oncology | Admitting: Radiation Oncology

## 2020-02-01 ENCOUNTER — Other Ambulatory Visit: Payer: Self-pay

## 2020-02-01 ENCOUNTER — Inpatient Hospital Stay: Payer: Medicare HMO

## 2020-02-01 DIAGNOSIS — Z510 Encounter for antineoplastic radiation therapy: Secondary | ICD-10-CM | POA: Diagnosis not present

## 2020-02-02 ENCOUNTER — Ambulatory Visit: Payer: Medicare HMO

## 2020-02-02 ENCOUNTER — Ambulatory Visit
Admission: RE | Admit: 2020-02-02 | Discharge: 2020-02-02 | Disposition: A | Discharge status: Home / Self Care | Payer: Medicare HMO | Source: Ambulatory Visit | Attending: Radiation Oncology | Admitting: Radiation Oncology

## 2020-02-02 ENCOUNTER — Other Ambulatory Visit: Payer: Self-pay

## 2020-02-02 ENCOUNTER — Inpatient Hospital Stay: Payer: Medicare HMO

## 2020-02-02 DIAGNOSIS — Z510 Encounter for antineoplastic radiation therapy: Secondary | ICD-10-CM | POA: Diagnosis not present

## 2020-02-05 ENCOUNTER — Inpatient Hospital Stay: Payer: Medicare HMO

## 2020-02-05 ENCOUNTER — Ambulatory Visit: Payer: Medicare HMO

## 2020-02-05 ENCOUNTER — Ambulatory Visit
Admission: RE | Admit: 2020-02-05 | Discharge: 2020-02-05 | Disposition: A | Discharge status: Home / Self Care | Payer: Medicare HMO | Source: Ambulatory Visit | Attending: Radiation Oncology | Admitting: Radiation Oncology

## 2020-02-05 ENCOUNTER — Other Ambulatory Visit: Payer: Self-pay

## 2020-02-05 DIAGNOSIS — Z510 Encounter for antineoplastic radiation therapy: Secondary | ICD-10-CM | POA: Diagnosis not present

## 2020-02-06 ENCOUNTER — Inpatient Hospital Stay: Payer: Medicare HMO

## 2020-02-06 ENCOUNTER — Ambulatory Visit
Admission: RE | Admit: 2020-02-06 | Discharge: 2020-02-06 | Disposition: A | Discharge status: Home / Self Care | Payer: Medicare HMO | Source: Ambulatory Visit | Attending: Radiation Oncology | Admitting: Radiation Oncology

## 2020-02-06 ENCOUNTER — Ambulatory Visit: Payer: Medicare HMO

## 2020-02-06 DIAGNOSIS — Z510 Encounter for antineoplastic radiation therapy: Secondary | ICD-10-CM | POA: Diagnosis not present

## 2020-02-06 DIAGNOSIS — C541 Malignant neoplasm of endometrium: Secondary | ICD-10-CM | POA: Diagnosis not present

## 2020-02-06 LAB — CBC
HCT: 28.2 % — ABNORMAL LOW (ref 36.0–46.0)
Hemoglobin: 8.9 g/dL — ABNORMAL LOW (ref 12.0–15.0)
MCH: 27.3 pg (ref 26.0–34.0)
MCHC: 31.6 g/dL (ref 30.0–36.0)
MCV: 86.5 fL (ref 80.0–100.0)
Platelets: 196 10*3/uL (ref 150–400)
RBC: 3.26 MIL/uL — ABNORMAL LOW (ref 3.87–5.11)
RDW: 17.3 % — ABNORMAL HIGH (ref 11.5–15.5)
WBC: 5.4 10*3/uL (ref 4.0–10.5)
nRBC: 0 % (ref 0.0–0.2)

## 2020-02-07 ENCOUNTER — Other Ambulatory Visit: Payer: Self-pay

## 2020-02-07 ENCOUNTER — Inpatient Hospital Stay: Payer: Medicare HMO

## 2020-02-07 ENCOUNTER — Inpatient Hospital Stay (HOSPITAL_BASED_OUTPATIENT_CLINIC_OR_DEPARTMENT_OTHER): Payer: Medicare HMO | Admitting: Obstetrics and Gynecology

## 2020-02-07 ENCOUNTER — Ambulatory Visit: Payer: Medicare HMO

## 2020-02-07 ENCOUNTER — Other Ambulatory Visit: Payer: Self-pay | Admitting: Nurse Practitioner

## 2020-02-07 ENCOUNTER — Ambulatory Visit
Admission: RE | Admit: 2020-02-07 | Discharge: 2020-02-07 | Disposition: A | Discharge status: Home / Self Care | Payer: Medicare HMO | Source: Ambulatory Visit | Attending: Radiation Oncology | Admitting: Radiation Oncology

## 2020-02-07 VITALS — BP 129/61 | HR 60 | Temp 96.8°F | Resp 18 | Wt 237.5 lb

## 2020-02-07 DIAGNOSIS — C541 Malignant neoplasm of endometrium: Secondary | ICD-10-CM | POA: Diagnosis not present

## 2020-02-07 DIAGNOSIS — E8809 Other disorders of plasma-protein metabolism, not elsewhere classified: Secondary | ICD-10-CM

## 2020-02-07 DIAGNOSIS — Z510 Encounter for antineoplastic radiation therapy: Secondary | ICD-10-CM | POA: Diagnosis not present

## 2020-02-07 DIAGNOSIS — R197 Diarrhea, unspecified: Secondary | ICD-10-CM | POA: Diagnosis not present

## 2020-02-07 MED ORDER — NYSTATIN 100000 UNIT/GM EX POWD: 1.0000 "application " | Freq: Two times a day (BID) | CUTANEOUS | 0 refills | Status: DC | PRN

## 2020-02-07 NOTE — Patient Instructions (Signed)
Food Choices to Help Relieve Diarrhea, Adult When you have diarrhea, the foods you eat and your eating habits are very important. Choosing the right foods and drinks can help:  Relieve diarrhea.  Replace lost fluids and nutrients.  Prevent dehydration. What general guidelines should I follow?  Relieving diarrhea  Choose foods with less than 2 g or .07 oz. of fiber per serving.  Limit fats to less than 8 tsp (38 g or 1.34 oz.) a day.  Avoid the following: ? Foods and beverages sweetened with high-fructose corn syrup, honey, or sugar alcohols such as xylitol, sorbitol, and mannitol. ? Foods that contain a lot of fat or sugar. ? Fried, greasy, or spicy foods. ? High-fiber grains, breads, and cereals. ? Raw fruits and vegetables.  Eat foods that are rich in probiotics. These foods include dairy products such as yogurt and fermented milk products. They help increase healthy bacteria in the stomach and intestines (gastrointestinal tract, or GI tract).  If you have lactose intolerance, avoid dairy products. These may make your diarrhea worse.  Take medicine to help stop diarrhea (antidiarrheal medicine) only as told by your health care provider. Replacing nutrients  Eat small meals or snacks every 3-4 hours.  Eat bland foods, such as white rice, toast, or baked potato, until your diarrhea starts to get better. Gradually reintroduce nutrient-rich foods as tolerated or as told by your health care provider. This includes: ? Well-cooked protein foods. ? Peeled, seeded, and soft-cooked fruits and vegetables. ? Low-fat dairy products.  Take vitamin and mineral supplements as told by your health care provider. Preventing dehydration  Start by sipping water or a special solution to prevent dehydration (oral rehydration solution, ORS). Urine that is clear or pale yellow means that you are getting enough fluid.  Try to drink at least 8-10 cups of fluid each day to help replace lost  fluids.  You may add other liquids in addition to water, such as clear juice or decaffeinated sports drinks, as tolerated or as told by your health care provider.  Avoid drinks with caffeine, such as coffee, tea, or soft drinks.  Avoid alcohol. What foods are recommended?     The items listed may not be a complete list. Talk with your health care provider about what dietary choices are best for you. Grains White rice. White, French, or pita breads (fresh or toasted), including plain rolls, buns, or bagels. White pasta. Saltine, soda, or graham crackers. Pretzels. Low-fiber cereal. Cooked cereals made with water (such as cornmeal, farina, or cream cereals). Plain muffins. Matzo. Melba toast. Zwieback. Vegetables Potatoes (without the skin). Most well-cooked and canned vegetables without skins or seeds. Tender lettuce. Fruits Apple sauce. Fruits canned in juice. Cooked apricots, cherries, grapefruit, peaches, pears, or plums. Fresh bananas and cantaloupe. Meats and other protein foods Baked or boiled chicken. Eggs. Tofu. Fish. Seafood. Smooth nut butters. Ground or well-cooked tender beef, ham, veal, lamb, pork, or poultry. Dairy Plain yogurt, kefir, and unsweetened liquid yogurt. Lactose-free milk, buttermilk, skim milk, or soy milk. Low-fat or nonfat hard cheese. Beverages Water. Low-calorie sports drinks. Fruit juices without pulp. Strained tomato and vegetable juices. Decaffeinated teas. Sugar-free beverages not sweetened with sugar alcohols. Oral rehydration solutions, if approved by your health care provider. Seasoning and other foods Bouillon, broth, or soups made from recommended foods. What foods are not recommended? The items listed may not be a complete list. Talk with your health care provider about what dietary choices are best for you. Grains Whole   grain, whole wheat, bran, or rye breads, rolls, pastas, and crackers. Wild or brown rice. Whole grain or bran cereals. Barley.  Oats and oatmeal. Corn tortillas or taco shells. Granola. Popcorn. Vegetables Raw vegetables. Fried vegetables. Cabbage, broccoli, Brussels sprouts, artichokes, baked beans, beet greens, corn, kale, legumes, peas, sweet potatoes, and yams. Potato skins. Cooked spinach and cabbage. Fruits Dried fruit, including raisins and dates. Raw fruits. Stewed or dried prunes. Canned fruits with syrup. Meat and other protein foods Fried or fatty meats. Deli meats. Chunky nut butters. Nuts and seeds. Beans and lentils. Bacon. Hot dogs. Sausage. Dairy High-fat cheeses. Whole milk, chocolate milk, and beverages made with milk, such as milk shakes. Half-and-half. Cream. sour cream. Ice cream. Beverages Caffeinated beverages (such as coffee, tea, soda, or energy drinks). Alcoholic beverages. Fruit juices with pulp. Prune juice. Soft drinks sweetened with high-fructose corn syrup or sugar alcohols. High-calorie sports drinks. Fats and oils Butter. Cream sauces. Margarine. Salad oils. Plain salad dressings. Olives. Avocados. Mayonnaise. Sweets and desserts Sweet rolls, doughnuts, and sweet breads. Sugar-free desserts sweetened with sugar alcohols such as xylitol and sorbitol. Seasoning and other foods Honey. Hot sauce. Chili powder. Gravy. Cream-based or milk-based soups. Pancakes and waffles. Summary  When you have diarrhea, the foods you eat and your eating habits are very important.  Make sure you get at least 8-10 cups of fluid each day, or enough to keep your urine clear or pale yellow.  Eat bland foods and gradually reintroduce healthy, nutrient-rich foods as tolerated, or as told by your health care provider.  Avoid high-fiber, fried, greasy, or spicy foods. This information is not intended to replace advice given to you by your health care provider. Make sure you discuss any questions you have with your health care provider. Document Revised: 06/02/2018 Document Reviewed: 02/07/2016 [employer] Patient  Education  2020 [employer]  

## 2020-02-07 NOTE — Progress Notes (Signed)
Gynecologic Oncology Interval Visit   Referring Provider: Malachy Mood, MD [address] 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 female who is seen in consultation from Dr. Georgianne Fick for high-grade endometrial adenocarcinoma.  She has started radiation treatments with Dr. Baruch Gouty. She is scheduled to start brachytherapy on 02/27/2020.   Dr. Tasia Catchings documented on 12/25/2019. Given her multiple medical problems, including HTN, HLD, CAD, history of MI in 2012, LVEF 50%, CKD, COPD, lupus nephritis, morbid obesity, PS 2-3, the recommendation was for adjuvant radiation, without adjuvant chemotherapy.   She was accompanied to this appointment by Denyse Dago.   Gynecologic Oncology History Bridget Murphy is a pleasant female who is seen in consultation from Dr. Georgianne Fick for high-grade endometrial adenocarcinoma and postmenopausal bleeding. Please see prior notes for complete details.  Pelvic US 10/23/2019 The uterus is anteverted and measures 11.3 x 7.0 x 6.0 cm. Echo texture is heterogenous with evidence of focal masses. Within the uterus There are possibly submucosal fibroids vs. Polyps measuring 25.2 x 21.6 x 31.3 mm, 16.7 x 8.3 x 13.8 mm, 12.9 x 8.9 x 12.3 mm. There is also a thick endometrium. The lining in the cervical canal is thick also.   The Endometrium measures 29.0 mm. No IUD is seen.   Right Ovary measures 3.2 x 2.1 x 1.7 cm. It is normal in appearance. There is a follicle in the right ovary measuring 12.0 x 7.9 x 11.0 mm Left Ovary measures 2.5 x 2.2 x 1.0 cm. It is normal in appearance. Survey of the adnexa demonstrates no adnexal masses. There is no free fluid in the cul de sac.  Impression: 1. There are possible fibroids and or polyps within the endometrium canal.  2. The endometrial lining appears thick, as well as, the cervical canal lining.  3. The  ovaries appear normal.  4. No IUD is seen.    10/23/2019 Endometrial biopsy performed   A. ENDOMETRIUM, BIOPSY:  - High-grade endometrial adenocarcinoma, FIGO grade 3.  - The carcinoma has mostly endometrioid features.    Preop CT CA/P 11/06/2019:  IMPRESSION: 1. No evidence of metastatic disease within the chest, abdomen, or pelvis. 2. Known endometrial thickening is not well appreciated on this noncontrast examination. 3. Extensive coronary artery calcification. Left anterior descending coronary artery stenting has been performed.  On 11/30/2019 she underwent Exam under anesthesia, Diagnostic laparoscopy, Robot-assisted total laparoscopic hysterectomy, Bilateral salpingo-oophorectomy, Sentinel lymph node mapping, Bilateral pelvic sentinel lymph node biopsy, and Omental biopsy.  DIAGNOSIS  A.  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 Negative for lymphatic vascular space invasion.  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  Right ovary and fallopian tube: Negative for malignancy. Left ovary and fallopian tube: Negative for malignancy.  Microsatellite instability and mismatch repair protein immunohistochemistry: Pending, paraffin block A2, to be reported separately from the molecular diagnostics laboratory.  See synoptic report below.   B.  Right obturator, primary sentinel node, biopsy: One lymph node, negative for malignancy (0/1).   C.  Left external iliac artery sentinel lymph node, biopsy: One lymph node, negative for malignancy (0/1).   D.  Left obturator, primary sentinel lymph node, biopsy: One lymph node, negative for malignancy (0/1).  E.  Left large obturator lymph node, biopsy: One lymph node,  negative for metastatic carcinoma (0/1).. See comment.  Comment: Because of the large size of this lymph node (4.2 cm), it will be referred to our hematopathology division for additional review.   An addendum will follow.   F.  Left internal iliac, biopsy: One lymph node, negative for malignancy (0/1).   G.  Omentum, omentectomy: Omentum, negative for malignancy.     Electronically signed by Rush Barer, MD on 12/11/2019 at 1559  Addendum 2  This part of hematopathology consultation (specimen E) has been reviewed in our weekly hematopathology QA conference, attended by Drs. Alvera Novel, ASL, JN, SW and EC besides me, and the interpretation and diagnosis rendered above reflect the consensus of the discussion among the attendee.  Addendum electronically signed by Lynnae Sandhoff, MD on 12/18/2019 at [place]  Regarding the enlarged lymph node (left obturator, specimen E), microscopic examination reveals profiles of lymph node with somewhat preserved normal nodal architecture. The cellular constituents appear somewhat disorganized, with slightly expanded sinuses and abundant pink fibrillar hyaline material (with focal calcification) scattered throughout the node.  A panel of immunohistochemical stains was performed after review of H&E slides, on block E2, to assess the lymph node, with appropriate positive and negative controls:  IHC Result  CD3/CD20 Highlights small mature B-cells (follicles) and T-cells in appropriate distributions.  BCL2 Negative in germinal centers. Positive in scattered paracortical cells.  BCL6 Highlights germinal centers.  Ki67 Appropriately elevated in germinal centers, which appear well-defined with retained polarity. Otherwise very low proliferation index.  Kappa/Lambda Light chain expression appears polytypic.  S100 Essentially negative.  CD68 Highlights increased sinus histiocytes.   Taken together, the morphologic and  immunophenotypic findings are consistent with reactive follicular hyperplasia with sinus histiocytosis. There is no histopathologic evidence of lymphoproliferative disorder. The etiology of the abundant sclerotic change with focal calcification is unclear.    [place]: Diagnostic Interpretation A ATYPICAL/INCONCLUSIVE. Marland Kitchen Rare atypical cells, too few to classify, see COMMENT. COMMENT: The cell block is paucicellular and does not contain the cells of interest, precluding further evaluation.  DMMR/MSI status  Mismatch repair: INTACT A. Immunohistochemistry: Normal result. Expression of MLH1, MSH2, MSH6, and PMS2 is retained B. PCR: Microsatellite stable (MSS)  P53 immunohistochemistry (block A1):   Negative (wild type pattern).   Tumor Board recommendations:  Recommendations: WPRT + VBT vs 3 cycles carboplatin and paclitaxel +VBT (GOG249 regimen), vs EBRT/cisplatin + 4 cycles carboplatin and paclitaxel (PORTEC3 regimen) vs consider VBT alone (not first choice due to grade, tumor size, deep invasion, and cervical stromal invasion)      She also has significant medical history with h/o MI, CHF, COPD, stage 3 chronic kidney disease, and elevated BMI.   Germline testing: not indicated  Problem List: Patient Active Problem List   Diagnosis Date Noted  . Goals of care, counseling/discussion 01/27/2020  . Endometrial adenocarcinoma ([place]) 11/01/2019  . Bilateral chronic knee pain 04/04/2019  . Ds DNA antibody positive 04/04/2019  . Lymphedema of both lower extremities 04/04/2019  . Hyperparathyroidism due to renal insufficiency ([place]) 02/19/2019  . Acute gout involving toe 09/28/2016  . Systemic lupus erythematosus ([place]) 09/28/2016  . Chronic diastolic CHF (congestive heart failure) ([place]) 09/11/2015  . Stage 3 chronic kidney disease ([place]) 03/11/2015  . Acute myocardial infarction, unspecified ([place]) 10/04/2013  . Essential hypertension 10/04/2013  . Hyperlipidemia 10/04/2013  . Morbid  obesity ([place]) 10/04/2013  . Other specified postprocedural states 10/04/2013  Past Medical History: Past Medical History:  Diagnosis Date  . Acute myocardial infarction [facility])    s/p three stents; anterior STEMI, DES LAD 09/25/2010  . Arthritis   . Chronic kidney disease   . COPD (chronic obstructive pulmonary disease) ([place])   . Hyperlipidemia   . Hypertension     Past Surgical History: Past Surgical History:  Procedure Laterality Date  . HYSTERECTOMY ABDOMINAL WITH SALPINGO-OOPHORECTOMY  11/30/2019    Exam under anesthesia, Diagnostic laparoscopy, Robot-assisted total laparoscopic hysterectomy, Bilateral salpingo-oophorectomy, Sentinel lymph node mapping, Bilateral pelvic sentinel lymph node biopsy, and Omental biopsy.    Past Gynecologic History:  As per HPI  OB History:  OB History  Gravida Para Term Preterm AB Living  1 1          SAB IAB Ectopic Multiple Live Births               # Outcome Date GA Lbr Len/2nd Weight Sex Delivery Anes PTL Lv  1 Para             Obstetric Comments  SVD x 1    Family History: Family History  Problem Relation Age of Onset  . Cervical cancer Mother   . Cervical cancer Sister   . Prostate cancer Brother   . Prostate cancer Brother   . Prostate cancer Brother    Immunization History  Administered Date(s) Administered  . Moderna Sars-Covid-2 Vaccination 05/31/2019, 06/24/2019   Social History: Social History   Socioeconomic History  . Marital status: Widowed    Spouse name: Not on file  . Number of children: Not on file  . Years of education: Not on file  . Highest education level: Not on file  Occupational History  . Not on file  Tobacco Use  . Smoking status: Former Smoker    Packs/day: 0.50    Quit date: 12/25/2010    Years since quitting: 9.1  . Smokeless tobacco: Never Used  Vaping Use  . Vaping Use: Never used  Substance and Sexual Activity  . Alcohol use: Not Currently  . Drug use: Never  . Sexual activity:  Not on file  Other Topics Concern  . Not on file  Social History Narrative  . Not on file   Social Determinants of Health   Financial Resource Strain: Not on file  Food Insecurity: Not on file  Transportation Needs: Not on file  Physical Activity: Not on file  Stress: Not on file  Social Connections: Not on file  Intimate Partner Violence: Not on file    Allergies: Allergies  Allergen Reactions  . Furosemide Rash    Current Medications: Current Outpatient Medications  Medication Sig Dispense Refill  . allopurinol (ZYLOPRIM) 100 MG tablet Take 50 mg by mouth daily.     Marland Kitchen aspirin 81 MG EC tablet Take 81 mg by mouth.     Marland Kitchen atorvastatin (LIPITOR) 40 MG tablet Take by mouth.    . calcium elemental as carbonate (TUMS ULTRA 1000) 400 MG chewable tablet Chew by mouth daily as needed.     . clopidogrel (PLAVIX) 75 MG tablet     . diclofenac Sodium (VOLTAREN) 1 % GEL Apply topically.    . furosemide (LASIX) 20 MG tablet     . ibuprofen (ADVIL) 100 MG/5ML suspension Take 200 mg by mouth every 4 (four) hours as needed.    Marland Kitchen lisinopril-hydrochlorothiazide (ZESTORETIC) 10-12.5 MG tablet Take by mouth.    . metoprolol succinate (TOPROL-XL) 50 MG 24 hr tablet  Take by mouth.    . nitroGLYCERIN (NITROSTAT) 0.4 MG SL tablet     . oxyCODONE (OXY IR/ROXICODONE) 5 MG immediate release tablet Take 5 mg by mouth every 4 (four) hours as needed.     . polyethylene glycol powder (GLYCOLAX/MIRALAX) 17 GM/SCOOP powder Take 0.5 Containers by mouth daily.     Marland Kitchen senna-docusate (SENOKOT-S) 8.6-50 MG tablet Take by mouth.    . silver sulfADIAZINE (SILVADENE) 1 % cream Apply 1 application topically daily. 85 g 2  . enoxaparin (LOVENOX) 40 MG/0.4ML injection Inject into the skin.    Marland Kitchen nystatin (MYCOSTATIN/NYSTOP) powder Apply 1 application topically 2 (two) times daily as needed. 60 g 0   No current facility-administered medications for this visit.    Review of Systems General: no complaints  HEENT: no  complaints  Lungs: no complaints  Cardiac: no complaints  GI: diarrhea 1-2 times a day  GU: occasional urinary incontinence  Musculoskeletal: no complaints  Extremities: chronic leg swelling o/w no complaints  Skin: no complaints  Neuro: no complaints  Endocrine: no complaints  Psych: no complaints      Objective:  Physical Examination:  BP 129/61   Pulse 60   Temp (!) 96.8 F (36 C) (Tympanic)   Resp 18   Wt 237 lb 8 oz (107.7 kg)   SpO2 100%   BMI 38.33 kg/m   ECOG Performance status: 2   GENERAL: Patient is a well appearing female in no acute distress HEENT:  PERRL, HEENT NODES:  No inguinal lymphadenopathy palpated.  ABDOMEN:  Soft, nontender, nondistended. No hernias/ascites/masses.  EXTREMITIES:  stable peripheral edema.   SKIN:  Erythema and desquamation under the pannus NEURO:  Nonfocal. Well oriented.  Appropriate affect.  Pelvic: Chaperoned by nursing.  EGBUS: no lesions Cervix: surgically absent Vagina: no lesions, no discharge or bleeding; cuff intact with visible green sutures. Healing well without granulation tissue or erythema.  Uterus: surgically absent Adnexa: surgically absent BME: no palpable masses; vaginal cuff intact Rectovaginal: deferred     Lab Review Lab Results  Component Value Date   WBC 5.4 02/06/2020   HGB 8.9 (L) 02/06/2020   HCT 28.2 (L) 02/06/2020   MCV 86.5 02/06/2020   PLT 196 02/06/2020     Chemistry      Component Value Date/Time   NA 140 11/01/2019 1136   K 4.4 11/01/2019 1136   CL 105 11/01/2019 1136   CO2 25 11/01/2019 1136   BUN 24 (H) 11/01/2019 1136   CREATININE 1.18 (H) 11/01/2019 1136      Component Value Date/Time   CALCIUM 9.1 11/01/2019 1136   ALKPHOS 76 11/01/2019 1136   AST 18 11/01/2019 1136   ALT 9 11/01/2019 1136   BILITOT 0.7 11/01/2019 1136     12/01/2019 Creatinine 1.8  Albumin 2.6  Radiologic Imaging: As noted    Assessment:  Bridget Murphy is a 74 y.o. female diagnosed with stage II  grade2 endometrioid endometrial cancer (MSS, pMMR, p53 wildtype) with atypical inconclusive washings.   Slow vaginal cuff healing, may be due to hypoalbuminemia  Skin candidiasis vs dermatologic toxicity from radiation.   Diarrhea, mild, possibly due to radiation  Elevated creatinine with known history of CKD  Leg swelling stable  Medical co-morbidities complicating care: HTN and obesity, prior MI s/p 3 stents on Plavix, h/o CHF, chronic kidney disease, and COPD.  Plan:   Problem List Items Addressed This Visit      Genitourinary   Endometrial adenocarcinoma ([place]) - Primary  Relevant Orders   Comprehensive metabolic panel    Other Visit Diagnoses    Diarrhea, unspecified type         Continue radiation therapy. Plan for repeat pelvic exam prior to brachytherapy. May need to delay brachytherapy to 03/12/2020 to allow complete healing.   Tumor Board recommendation was for 3 cycles of chemotherapy (p53 wild-type). Dr. Tasia Catchings felt that given her multiple medical problems, including HTN, HLD, CAD, history of MI in 2012, LVEF 50%, CKD, COPD, lupus nephritis, morbid obesity, PS 2-3, the recommendation was for adjuvant radiation, without adjuvant chemotherapy. Her PS seems to be improving and I discussed adding chemotherapy with Ms. Trenton Gammon. I reviewed the goal of chemotherapy to reduce her recurrence risk, but that chemotherapy is associated with risks that may negatively affect her quality of life. She is not interested in chemotherapy at this time. Her PS is 2. While she is able to make her own meals she is in a chair >50% of the day.   Check chemistry panel. If still hypoalbuminemia consider nutrition supplements.   Nystatin for skin changes associated with the pannus. Continue topical Ascend barrier cream as needed.   Diarrhea, follow Dr. Baruch Gouty recommendations and we provided information regarding the BRAT diet.   Suggested return to clinic in ~4 weeks for vaginal cuff check and then  again in 3 months after that for surveillance exam. At that time will discuss surveillance plan.   The patient's diagnosis, an outline of the further diagnostic and laboratory studies which will be required, the recommendation, and alternatives were discussed.  All questions were answered to the patient's satisfaction.  A total of at least 40 minutes were spent with the patient/family today; >50% was spent in education, counseling and coordination of care for endometrial cancer and postoperative adjuvant therapy.     Demetress Tift Gaetana Michaelis, MD    CC:  Malachy Mood, MD [address] [place],  [place] [address] [phone]

## 2020-02-08 ENCOUNTER — Ambulatory Visit: Payer: Medicare HMO

## 2020-02-08 ENCOUNTER — Inpatient Hospital Stay: Payer: Medicare HMO

## 2020-02-08 ENCOUNTER — Other Ambulatory Visit: Payer: Self-pay | Admitting: *Deleted

## 2020-02-08 ENCOUNTER — Ambulatory Visit
Admission: RE | Admit: 2020-02-08 | Discharge: 2020-02-08 | Disposition: A | Discharge status: Home / Self Care | Payer: Medicare HMO | Source: Ambulatory Visit | Attending: Radiation Oncology | Admitting: Radiation Oncology

## 2020-02-08 DIAGNOSIS — C541 Malignant neoplasm of endometrium: Secondary | ICD-10-CM

## 2020-02-08 DIAGNOSIS — Z510 Encounter for antineoplastic radiation therapy: Secondary | ICD-10-CM | POA: Diagnosis not present

## 2020-02-08 LAB — COMPREHENSIVE METABOLIC PANEL
ALT: 10 U/L (ref 0–44)
AST: 20 U/L (ref 15–41)
Albumin: 3.2 g/dL — ABNORMAL LOW (ref 3.5–5.0)
Alkaline Phosphatase: 58 U/L (ref 38–126)
Anion gap: 9 (ref 5–15)
BUN: 26 mg/dL — ABNORMAL HIGH (ref 8–23)
CO2: 25 mmol/L (ref 22–32)
Calcium: 8.6 mg/dL — ABNORMAL LOW (ref 8.9–10.3)
Chloride: 103 mmol/L (ref 98–111)
Creatinine, Ser: 1.17 mg/dL — ABNORMAL HIGH (ref 0.44–1.00)
GFR, Estimated: 49 mL/min — ABNORMAL LOW (ref >60)
Glucose, Bld: 93 mg/dL (ref 70–99)
Potassium: 3.8 mmol/L (ref 3.5–5.1)
Sodium: 137 mmol/L (ref 135–145)
Total Bilirubin: 0.5 mg/dL (ref 0.3–1.2)
Total Protein: 6.7 g/dL (ref 6.5–8.1)

## 2020-02-08 LAB — CBC WITH DIFFERENTIAL/PLATELET
Abs Immature Granulocytes: 0.02 10*3/uL (ref 0.00–0.07)
Basophils Absolute: 0 10*3/uL (ref 0.0–0.1)
Basophils Relative: 0 %
Eosinophils Absolute: 0.4 10*3/uL (ref 0.0–0.5)
Eosinophils Relative: 8 %
HCT: 28.2 % — ABNORMAL LOW (ref 36.0–46.0)
Hemoglobin: 8.7 g/dL — ABNORMAL LOW (ref 12.0–15.0)
Immature Granulocytes: 0 %
Lymphocytes Relative: 8 %
Lymphs Abs: 0.4 10*3/uL — ABNORMAL LOW (ref 0.7–4.0)
MCH: 26.4 pg (ref 26.0–34.0)
MCHC: 30.9 g/dL (ref 30.0–36.0)
MCV: 85.7 fL (ref 80.0–100.0)
Monocytes Absolute: 0.4 10*3/uL (ref 0.1–1.0)
Monocytes Relative: 8 %
Neutro Abs: 3.8 10*3/uL (ref 1.7–7.7)
Neutrophils Relative %: 76 %
Platelets: 198 10*3/uL (ref 150–400)
RBC: 3.29 MIL/uL — ABNORMAL LOW (ref 3.87–5.11)
RDW: 17.4 % — ABNORMAL HIGH (ref 11.5–15.5)
WBC: 5.1 10*3/uL (ref 4.0–10.5)
nRBC: 0 % (ref 0.0–0.2)

## 2020-02-09 ENCOUNTER — Other Ambulatory Visit: Payer: Self-pay

## 2020-02-09 ENCOUNTER — Ambulatory Visit: Payer: Medicare HMO

## 2020-02-09 ENCOUNTER — Ambulatory Visit
Admission: RE | Admit: 2020-02-09 | Discharge: 2020-02-09 | Disposition: A | Discharge status: Home / Self Care | Payer: Medicare HMO | Source: Ambulatory Visit | Attending: Radiation Oncology | Admitting: Radiation Oncology

## 2020-02-09 ENCOUNTER — Inpatient Hospital Stay: Payer: Medicare HMO

## 2020-02-09 DIAGNOSIS — Z510 Encounter for antineoplastic radiation therapy: Secondary | ICD-10-CM | POA: Diagnosis not present

## 2020-02-12 ENCOUNTER — Ambulatory Visit: Payer: Medicare HMO

## 2020-02-12 ENCOUNTER — Other Ambulatory Visit: Payer: Self-pay

## 2020-02-12 ENCOUNTER — Inpatient Hospital Stay: Payer: Medicare HMO

## 2020-02-12 ENCOUNTER — Ambulatory Visit
Admission: RE | Admit: 2020-02-12 | Discharge: 2020-02-12 | Disposition: A | Discharge status: Home / Self Care | Payer: Medicare HMO | Source: Ambulatory Visit | Attending: Radiation Oncology | Admitting: Radiation Oncology

## 2020-02-12 DIAGNOSIS — Z510 Encounter for antineoplastic radiation therapy: Secondary | ICD-10-CM | POA: Diagnosis not present

## 2020-02-13 ENCOUNTER — Inpatient Hospital Stay: Payer: Medicare HMO

## 2020-02-13 ENCOUNTER — Other Ambulatory Visit: Payer: Self-pay

## 2020-02-13 ENCOUNTER — Ambulatory Visit: Payer: Medicare HMO

## 2020-02-13 ENCOUNTER — Ambulatory Visit
Admission: RE | Admit: 2020-02-13 | Discharge: 2020-02-13 | Disposition: A | Discharge status: Home / Self Care | Payer: Medicare HMO | Source: Ambulatory Visit | Attending: Radiation Oncology | Admitting: Radiation Oncology

## 2020-02-13 DIAGNOSIS — Z510 Encounter for antineoplastic radiation therapy: Secondary | ICD-10-CM | POA: Diagnosis not present

## 2020-02-14 ENCOUNTER — Inpatient Hospital Stay: Payer: Medicare HMO

## 2020-02-14 ENCOUNTER — Other Ambulatory Visit: Payer: Self-pay

## 2020-02-14 ENCOUNTER — Ambulatory Visit
Admission: RE | Admit: 2020-02-14 | Discharge: 2020-02-14 | Disposition: A | Discharge status: Home / Self Care | Payer: Medicare HMO | Source: Ambulatory Visit | Attending: Radiation Oncology | Admitting: Radiation Oncology

## 2020-02-14 ENCOUNTER — Ambulatory Visit: Payer: Medicare HMO

## 2020-02-14 DIAGNOSIS — Z510 Encounter for antineoplastic radiation therapy: Secondary | ICD-10-CM | POA: Diagnosis not present

## 2020-02-19 ENCOUNTER — Telehealth: Payer: Self-pay

## 2020-02-19 NOTE — Telephone Encounter (Signed)
Bridget Murphy returned call. We discussed her slightly low albumin. Educated on following a good nutritious diet rich in protein. She can also utilize nutrition supplements, like ensure, on occasion. She reports she is eating well and will follow our guidance.

## 2020-02-19 NOTE — Telephone Encounter (Signed)
Voicemail left with Ms. Lorenzetti to return call to discuss recent Albumin.

## 2020-02-20 ENCOUNTER — Ambulatory Visit: Payer: Medicare HMO

## 2020-02-21 ENCOUNTER — Ambulatory Visit: Payer: Medicare HMO

## 2020-02-26 ENCOUNTER — Ambulatory Visit: Payer: Medicare HMO

## 2020-02-27 ENCOUNTER — Ambulatory Visit: Payer: Medicare HMO

## 2020-02-28 ENCOUNTER — Other Ambulatory Visit: Payer: Self-pay

## 2020-02-28 ENCOUNTER — Inpatient Hospital Stay: Payer: Medicare HMO | Attending: Obstetrics and Gynecology | Admitting: Obstetrics and Gynecology

## 2020-02-28 VITALS — BP 126/56 | HR 57 | Resp 20 | Wt 230.5 lb

## 2020-02-28 DIAGNOSIS — Z7902 Long term (current) use of antithrombotics/antiplatelets: Secondary | ICD-10-CM | POA: Insufficient documentation

## 2020-02-28 DIAGNOSIS — I2510 Atherosclerotic heart disease of native coronary artery without angina pectoris: Secondary | ICD-10-CM | POA: Insufficient documentation

## 2020-02-28 DIAGNOSIS — I5032 Chronic diastolic (congestive) heart failure: Secondary | ICD-10-CM | POA: Insufficient documentation

## 2020-02-28 DIAGNOSIS — J449 Chronic obstructive pulmonary disease, unspecified: Secondary | ICD-10-CM | POA: Diagnosis not present

## 2020-02-28 DIAGNOSIS — Z6837 Body mass index (BMI) 37.0-37.9, adult: Secondary | ICD-10-CM | POA: Diagnosis not present

## 2020-02-28 DIAGNOSIS — Z90722 Acquired absence of ovaries, bilateral: Secondary | ICD-10-CM | POA: Diagnosis not present

## 2020-02-28 DIAGNOSIS — C541 Malignant neoplasm of endometrium: Secondary | ICD-10-CM | POA: Diagnosis present

## 2020-02-28 DIAGNOSIS — Z87891 Personal history of nicotine dependence: Secondary | ICD-10-CM | POA: Insufficient documentation

## 2020-02-28 DIAGNOSIS — E785 Hyperlipidemia, unspecified: Secondary | ICD-10-CM | POA: Insufficient documentation

## 2020-02-28 DIAGNOSIS — R3915 Urgency of urination: Secondary | ICD-10-CM | POA: Insufficient documentation

## 2020-02-28 DIAGNOSIS — E8809 Other disorders of plasma-protein metabolism, not elsewhere classified: Secondary | ICD-10-CM | POA: Diagnosis not present

## 2020-02-28 DIAGNOSIS — I129 Hypertensive chronic kidney disease with stage 1 through stage 4 chronic kidney disease, or unspecified chronic kidney disease: Secondary | ICD-10-CM | POA: Insufficient documentation

## 2020-02-28 DIAGNOSIS — Z90710 Acquired absence of both cervix and uterus: Secondary | ICD-10-CM | POA: Insufficient documentation

## 2020-02-28 DIAGNOSIS — R32 Unspecified urinary incontinence: Secondary | ICD-10-CM | POA: Diagnosis not present

## 2020-02-28 DIAGNOSIS — N2581 Secondary hyperparathyroidism of renal origin: Secondary | ICD-10-CM | POA: Diagnosis not present

## 2020-02-28 DIAGNOSIS — R300 Dysuria: Secondary | ICD-10-CM

## 2020-02-28 DIAGNOSIS — N189 Chronic kidney disease, unspecified: Secondary | ICD-10-CM | POA: Insufficient documentation

## 2020-02-28 DIAGNOSIS — Z79899 Other long term (current) drug therapy: Secondary | ICD-10-CM | POA: Diagnosis not present

## 2020-02-28 DIAGNOSIS — Z923 Personal history of irradiation: Secondary | ICD-10-CM | POA: Insufficient documentation

## 2020-02-28 DIAGNOSIS — M3214 Glomerular disease in systemic lupus erythematosus: Secondary | ICD-10-CM | POA: Insufficient documentation

## 2020-02-28 DIAGNOSIS — I252 Old myocardial infarction: Secondary | ICD-10-CM | POA: Diagnosis not present

## 2020-02-28 LAB — URINALYSIS, COMPLETE (UACMP) WITH MICROSCOPIC
Bacteria, UA: NONE SEEN
Bilirubin Urine: NEGATIVE
Glucose, UA: NEGATIVE mg/dL
Hgb urine dipstick: NEGATIVE
Ketones, ur: NEGATIVE mg/dL
Nitrite: NEGATIVE
Protein, ur: NEGATIVE mg/dL
Specific Gravity, Urine: 1.017 (ref 1.005–1.030)
pH: 5 (ref 5.0–8.0)

## 2020-02-28 NOTE — Progress Notes (Signed)
Gynecologic Oncology Interval Visit   Referring Provider: Malachy Mood, MD 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 female who is seen in consultation from Dr. Georgianne Fick for high-grade endometrial adenocarcinoma.  She has started radiation treatments with Dr. Baruch Gouty.  She is scheduled to start brachytherapy next week.   Dr. Tasia Catchings documented on 12/25/2019. Given her multiple medical problems, including HTN, HLD, CAD, history of MI in 2012, LVEF 50%, CKD, COPD, lupus nephritis, morbid obesity, PS 2-3, the recommendation was for adjuvant radiation, without adjuvant chemotherapy.   She was accompanied to this appointment by Denyse Dago. Complains of some urinary leakage and urge incontinence that is new since radiation.   Gynecologic Oncology History Bridget Murphy is a pleasant female who is seen in consultation from Dr. Georgianne Fick for high-grade endometrial adenocarcinoma and postmenopausal bleeding. Please see prior notes for complete details.  Pelvic US 10/23/2019 The uterus is anteverted and measures 11.3 x 7.0 x 6.0 cm. Echo texture is heterogenous with evidence of focal masses. Within the uterus There are possibly submucosal fibroids vs. Polyps measuring 25.2 x 21.6 x 31.3 mm, 16.7 x 8.3 x 13.8 mm, 12.9 x 8.9 x 12.3 mm. There is also a thick endometrium. The lining in the cervical canal is thick also.   The Endometrium measures 29.0 mm. No IUD is seen.   Right Ovary measures 3.2 x 2.1 x 1.7 cm. It is normal in appearance. There is a follicle in the right ovary measuring 12.0 x 7.9 x 11.0 mm Left Ovary measures 2.5 x 2.2 x 1.0 cm. It is normal in appearance. Survey of the adnexa demonstrates no adnexal masses. There is no free fluid in the cul de sac.  Impression: 1. There are possible fibroids and or polyps within the endometrium canal.  2.  The endometrial lining appears thick, as well as, the cervical canal lining.  3. The ovaries appear normal.  4. No IUD is seen.    10/23/2019 Endometrial biopsy performed   A. ENDOMETRIUM, BIOPSY:  - High-grade endometrial adenocarcinoma, FIGO grade 3.  - The carcinoma has mostly endometrioid features.    Preop CT CA/P 11/06/2019:  IMPRESSION: 1. No evidence of metastatic disease within the chest, abdomen, or pelvis. 2. Known endometrial thickening is not well appreciated on this noncontrast examination. 3. Extensive coronary artery calcification. Left anterior descending coronary artery stenting has been performed.  On 11/30/2019 she underwent Exam under anesthesia, Diagnostic laparoscopy, Robot-assisted total laparoscopic hysterectomy, Bilateral salpingo-oophorectomy, Sentinel lymph node mapping, Bilateral pelvic sentinel lymph node biopsy, and Omental biopsy.  DIAGNOSIS  A.  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 Negative for lymphatic vascular space invasion.  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  Right ovary and fallopian tube: Negative for malignancy. Left ovary and fallopian tube: Negative for malignancy.  Microsatellite instability and mismatch repair protein immunohistochemistry: Pending, paraffin block A2, to be reported separately from the molecular diagnostics laboratory.  See synoptic report below.   B.  Right obturator, primary sentinel node, biopsy: One lymph node, negative for malignancy (0/1).   C.  Left external iliac artery sentinel lymph node, biopsy: One lymph node, negative for malignancy (0/1).   D.  Left  obturator, primary sentinel lymph node, biopsy: One lymph node, negative for  malignancy (0/1).   E.  Left large obturator lymph node, biopsy: One lymph node, negative for metastatic carcinoma (0/1).. See comment.  Comment: Because of the large size of this lymph node (4.2 cm), it will be referred to our hematopathology division for additional review.   An addendum will follow.   F.  Left internal iliac, biopsy: One lymph node, negative for malignancy (0/1).   G.  Omentum, omentectomy: Omentum, negative for malignancy.     Electronically signed by Rush Barer, MD on 12/11/2019 at 1559  Addendum 2  This part of hematopathology consultation (specimen E) has been reviewed in our weekly hematopathology QA conference, attended by Drs. Alvera Novel, ASL, JN, SW and EC besides me, and the interpretation and diagnosis rendered above reflect the consensus of the discussion among the attendee.  Addendum electronically signed by Lynnae Sandhoff, MD on 12/18/2019 at [place]  Regarding the enlarged lymph node (left obturator, specimen E), microscopic examination reveals profiles of lymph node with somewhat preserved normal nodal architecture. The cellular constituents appear somewhat disorganized, with slightly expanded sinuses and abundant pink fibrillar hyaline material (with focal calcification) scattered throughout the node.  A panel of immunohistochemical stains was performed after review of H&E slides, on block E2, to assess the lymph node, with appropriate positive and negative controls:  IHC Result  CD3/CD20 Highlights small mature B-cells (follicles) and T-cells in appropriate distributions.  BCL2 Negative in germinal centers. Positive in scattered paracortical cells.  BCL6 Highlights germinal centers.  Ki67 Appropriately elevated in germinal centers, which appear well-defined with retained polarity. Otherwise very low proliferation index.  Kappa/Lambda Light chain expression appears polytypic.  S100 Essentially negative.  CD68  Highlights increased sinus histiocytes.   Taken together, the morphologic and immunophenotypic findings are consistent with reactive follicular hyperplasia with sinus histiocytosis. There is no histopathologic evidence of lymphoproliferative disorder. The etiology of the abundant sclerotic change with focal calcification is unclear.    [place]ash[place]: Diagnostic Interpretation A ATYPICAL/INCONCLUSIVE. Marland Kitchen Rare atypical cells, too few to classify, see COMMENT. COMMENT: The cell block is paucicellular and does not contain the cells of interest, precluding further evaluation.  DMMR/MSI status  Mismatch repair: INTACT A. Immunohistochemistry: Normal result. Expression of MLH1, MSH2, MSH6, and PMS2 is retained B. PCR: Microsatellite stable (MSS)  P53 immunohistochemistry (block A1):   Negative (wild type pattern).   Tumor Board recommendations:  Recommendations: WPRT + VBT vs 3 cycles carboplatin and paclitaxel +VBT (GOG249 regimen), vs EBRT/cisplatin + 4 cycles carboplatin and paclitaxel (PORTEC3 regimen) vs consider VBT alone (not first choice due to grade, tumor size, deep invasion, and cervical stromal invasion)    She also has significant medical history with h/o MI, CHF, COPD, stage 3 chronic kidney disease, and elevated BMI.   Germline testing: not indicated  Problem List: Patient Active Problem List   Diagnosis Date Noted  . Goals of care, counseling/discussion 01/27/2020  . Endometrial adenocarcinoma ([place]) 11/01/2019  . Bilateral chronic knee pain 04/04/2019  . Ds DNA antibody positive 04/04/2019  . Lymphedema of both lower extremities 04/04/2019  . Hyperparathyroidism due to renal insufficiency ([place]) 02/19/2019  . Acute gout involving toe 09/28/2016  . Systemic lupus erythematosus ([place]) 09/28/2016  . Chronic diastolic CHF (congestive heart failure) ([place]) 09/11/2015  . Stage 3 chronic kidney disease ([place]) 03/11/2015  . Acute myocardial infarction, unspecified ([place]) 10/04/2013   . Essential hypertension 10/04/2013  . Hyperlipidemia 10/04/2013  .  Morbid obesity ([place]) 10/04/2013  . Other specified postprocedural states 10/04/2013    Past Medical History: Past Medical History:  Diagnosis Date  . Acute myocardial infarction [facility])    s/p three stents; anterior STEMI, DES LAD 09/25/2010  . Arthritis   . Chronic kidney disease   . COPD (chronic obstructive pulmonary disease) ([place])   . Hyperlipidemia   . Hypertension     Past Surgical History: Past Surgical History:  Procedure Laterality Date  . HYSTERECTOMY ABDOMINAL WITH SALPINGO-OOPHORECTOMY  11/30/2019    Exam under anesthesia, Diagnostic laparoscopy, Robot-assisted total laparoscopic hysterectomy, Bilateral salpingo-oophorectomy, Sentinel lymph node mapping, Bilateral pelvic sentinel lymph node biopsy, and Omental biopsy.    Past Gynecologic History:  As per HPI  OB History:  OB History  Gravida Para Term Preterm AB Living  1 1          SAB IAB Ectopic Multiple Live Births               # Outcome Date GA Lbr Len/2nd Weight Sex Delivery Anes PTL Lv  1 Para             Obstetric Comments  SVD x 1    Family History: Family History  Problem Relation Age of Onset  . Cervical cancer Mother   . Cervical cancer Sister   . Prostate cancer Brother   . Prostate cancer Brother   . Prostate cancer Brother    Immunization History  Administered Date(s) Administered  . Moderna Sars-Covid-2 Vaccination 05/31/2019, 06/24/2019   Social History: Social History   Socioeconomic History  . Marital status: Widowed    Spouse name: Not on file  . Number of children: Not on file  . Years of education: Not on file  . Highest education level: Not on file  Occupational History  . Not on file  Tobacco Use  . Smoking status: Former Smoker    Packs/day: 0.50    Quit date: 12/25/2010    Years since quitting: 9.1  . Smokeless tobacco: Never Used  Vaping Use  . Vaping Use: Never used  Substance and Sexual  Activity  . Alcohol use: Not Currently  . Drug use: Never  . Sexual activity: Not on file  Other Topics Concern  . Not on file  Social History Narrative  . Not on file   Social Determinants of Health   Financial Resource Strain: Not on file  Food Insecurity: Not on file  Transportation Needs: Not on file  Physical Activity: Not on file  Stress: Not on file  Social Connections: Not on file  Intimate Partner Violence: Not on file    Allergies: Allergies  Allergen Reactions  . Acetaminophen Other (See Comments)    "Heart fluttering"  . Furosemide Rash    Current Medications: Current Outpatient Medications  Medication Sig Dispense Refill  . allopurinol (ZYLOPRIM) 100 MG tablet Take 50 mg by mouth daily.     Marland Kitchen aspirin 81 MG EC tablet Take 81 mg by mouth.     Marland Kitchen atorvastatin (LIPITOR) 40 MG tablet Take by mouth.    . calcium elemental as carbonate (TUMS ULTRA 1000) 400 MG chewable tablet Chew by mouth daily as needed.     . clopidogrel (PLAVIX) 75 MG tablet     . diclofenac Sodium (VOLTAREN) 1 % GEL Apply topically.    . furosemide (LASIX) 20 MG tablet     . ibuprofen (ADVIL) 100 MG/5ML suspension Take 200 mg by mouth every 4 (four) hours as  needed.    Marland Kitchen lisinopril-hydrochlorothiazide (ZESTORETIC) 10-12.5 MG tablet Take by mouth.    . metoprolol succinate (TOPROL-XL) 50 MG 24 hr tablet Take by mouth.    . nitroGLYCERIN (NITROSTAT) 0.4 MG SL tablet     . nystatin (MYCOSTATIN/NYSTOP) powder Apply 1 application topically 2 (two) times daily as needed. 60 g 0  . oxyCODONE (OXY IR/ROXICODONE) 5 MG immediate release tablet Take 5 mg by mouth every 4 (four) hours as needed.     . polyethylene glycol powder (GLYCOLAX/MIRALAX) 17 GM/SCOOP powder Take 0.5 Containers by mouth daily.     Marland Kitchen senna-docusate (SENOKOT-S) 8.6-50 MG tablet Take by mouth.    . silver sulfADIAZINE (SILVADENE) 1 % cream Apply 1 application topically daily. 85 g 2   No current facility-administered medications for  this visit.    Review of Systems General: no complaints  HEENT: no complaints  Lungs: no complaints  Cardiac: no complaints  GI: diarrhea 1-2 times a day  GU: occasional urinary incontinence  Musculoskeletal: no complaints  Extremities: chronic leg swelling o/w no complaints  Skin: no complaints  Neuro: no complaints  Endocrine: no complaints  Psych: no complaints      Objective:  Physical Examination:  BP (!) 126/56   Pulse (!) 57   Resp 20   Wt 230 lb 8 oz (104.6 kg)   SpO2 100%   BMI 37.20 kg/m   ECOG Performance status: 2   GENERAL: Patient is a well appearing female in no acute distress HEENT:  PERRL, HEENT NODES:  No inguinal lymphadenopathy palpated.  ABDOMEN:  Soft, nontender, nondistended. No hernias/ascites/masses.  EXTREMITIES:  stable peripheral edema.   SKIN:  Erythema and desquamation under the pannus NEURO:  Nonfocal. Well oriented.  Appropriate affect.  Pelvic: Chaperoned by nursing.  EGBUS: no lesions Cervix: surgically absent Vagina: no lesions, no discharge or bleeding; cuff intact with visible green sutures. Healing well without granulation tissue or erythema.  Uterus: surgically absent Adnexa: surgically absent BME: no palpable masses; vaginal cuff intact Rectovaginal: deferred  Extremities: swollen bilaterally   Lab Review Lab Results  Component Value Date   WBC 5.1 02/08/2020   HGB 8.7 (L) 02/08/2020   HCT 28.2 (L) 02/08/2020   MCV 85.7 02/08/2020   PLT 198 02/08/2020     Chemistry      Component Value Date/Time   NA 137 02/08/2020 0844   K 3.8 02/08/2020 0844   CL 103 02/08/2020 0844   CO2 25 02/08/2020 0844   BUN 26 (H) 02/08/2020 0844   CREATININE 1.17 (H) 02/08/2020 0844      Component Value Date/Time   CALCIUM 8.6 (L) 02/08/2020 0844   ALKPHOS 58 02/08/2020 0844   AST 20 02/08/2020 0844   ALT 10 02/08/2020 0844   BILITOT 0.5 02/08/2020 0844     12/01/2019 Creatinine 1.8  Albumin 2.6  Radiologic Imaging: As  noted    Assessment:  Bridget Murphy is a 75 y.o. female diagnosed with stage II grade 2 endometrioid endometrial cancer (MSS, pMMR, p53 wildtype) with cervical stromal invasion and atypical inconclusive washings.  On 11/30/2019 she robot-assisted total laparoscopic hysterectomy, Bilateral salpingo-oophorectomy, Sentinel lymph node mapping, Bilateral pelvic sentinel lymph node biopsy, and Omental biopsy.     Slow vaginal cuff healing, may be due to hypoalbuminemia, but well healed now.   Skin candidiasis vs dermatologic toxicity from radiation.   Diarrhea, mild, possibly due to radiation  Elevated creatinine with known history of CKD. Urinary urgency and incontinence since started  radiation.    Leg swelling stable  Medical co-morbidities complicating care: HTN and obesity, prior MI s/p 3 stents on Plavix, h/o CHF, chronic kidney disease, and COPD.  Plan:   Problem List Items Addressed This Visit      Genitourinary   Endometrial adenocarcinoma ([place])    Other Visit Diagnoses    Dysuria    -  Primary   Relevant Orders   Urinalysis, Complete w Microscopic     Continue radiation therapy with brachytherapy next week now that there is complete vaginal healing.   Tumor Board recommendation was for 3 cycles of chemotherapy (p53 wild-type). Dr. Tasia Catchings felt that given her multiple medical problems, including HTN, HLD, CAD, history of MI in 2012, LVEF 50%, CKD, COPD, lupus nephritis, morbid obesity, PS 2-3, the recommendation was for adjuvant radiation, without adjuvant chemotherapy. Her PS seems to be improving and Dr Theora Gianotti discussed adding chemotherapy and reviewed the goal of chemotherapy to reduce her recurrence risk, but that chemotherapy is associated with risks that may negatively affect her quality of life. She is not interested in chemotherapy at this time. Her PS is 2. While she is able to make her own meals she is in a chair >50% of the day.   Nystatin for skin changes associated with the  pannus. Continue topical Ascend barrier cream as needed.   Diarrhea, follow Dr. Baruch Gouty recommendations and we provided information regarding the BRAT diet.   Will check urinary culture in view of GU symptoms, but suspect this is due to radiation effect. Told her she could wear a pad.   Suggested return to clinic in 3-4 months after that for surveillance exam.  At that time will discuss surveillance plan.   The patient's diagnosis, an outline of the further diagnostic and laboratory studies which will be required, the recommendation, and alternatives were discussed.  All questions were answered to the patient's satisfaction.  A total of at least 40 minutes were spent with the patient/family today; >50% was spent in education, counseling and coordination of care for endometrial cancer and postoperative adjuvant therapy.     Verlon Au, NP  I personally interviewed and examined the patient. Agreed with the above/below plan of care. I have directly contributed to assessment and plan of care of this patient and educated and discussed with patient and family.  Mellody Drown, MD  CC:  Malachy Mood, MD [address] [place],  [place] [address] [phone]

## 2020-02-29 ENCOUNTER — Ambulatory Visit: Payer: Medicare HMO

## 2020-03-04 ENCOUNTER — Ambulatory Visit: Payer: Medicare HMO

## 2020-03-05 ENCOUNTER — Ambulatory Visit: Payer: Medicare HMO

## 2020-03-06 ENCOUNTER — Other Ambulatory Visit: Payer: Self-pay

## 2020-03-06 ENCOUNTER — Inpatient Hospital Stay: Payer: Medicare HMO

## 2020-03-06 ENCOUNTER — Ambulatory Visit
Admission: RE | Admit: 2020-03-06 | Discharge: 2020-03-06 | Disposition: A | Discharge status: Home / Self Care | Payer: Medicare HMO | Source: Ambulatory Visit | Attending: Radiation Oncology | Admitting: Radiation Oncology

## 2020-03-06 DIAGNOSIS — Z510 Encounter for antineoplastic radiation therapy: Secondary | ICD-10-CM | POA: Diagnosis not present

## 2020-03-06 DIAGNOSIS — C541 Malignant neoplasm of endometrium: Secondary | ICD-10-CM | POA: Insufficient documentation

## 2020-03-07 DIAGNOSIS — Z510 Encounter for antineoplastic radiation therapy: Secondary | ICD-10-CM | POA: Diagnosis not present

## 2020-03-12 ENCOUNTER — Ambulatory Visit: Payer: Medicare HMO

## 2020-03-12 ENCOUNTER — Inpatient Hospital Stay: Payer: Medicare HMO

## 2020-03-13 ENCOUNTER — Telehealth: Payer: Self-pay | Admitting: Nurse Practitioner

## 2020-03-13 ENCOUNTER — Ambulatory Visit
Admission: RE | Admit: 2020-03-13 | Discharge: 2020-03-13 | Disposition: A | Discharge status: Home / Self Care | Payer: Medicare HMO | Source: Ambulatory Visit | Attending: Radiation Oncology | Admitting: Radiation Oncology

## 2020-03-13 ENCOUNTER — Inpatient Hospital Stay: Payer: Medicare HMO

## 2020-03-13 ENCOUNTER — Other Ambulatory Visit: Payer: Self-pay

## 2020-03-13 DIAGNOSIS — Z510 Encounter for antineoplastic radiation therapy: Secondary | ICD-10-CM | POA: Diagnosis not present

## 2020-03-13 DIAGNOSIS — C541 Malignant neoplasm of endometrium: Secondary | ICD-10-CM

## 2020-03-13 NOTE — Telephone Encounter (Signed)
UA was negative for infection. Spoke to patient who is not having urinary symptoms and is feeling well. Has follow up scheduled for 05/29/20.

## 2020-03-14 ENCOUNTER — Ambulatory Visit: Payer: Medicare HMO

## 2020-03-14 ENCOUNTER — Inpatient Hospital Stay: Payer: Medicare HMO

## 2020-03-15 ENCOUNTER — Ambulatory Visit
Admission: RE | Admit: 2020-03-15 | Discharge: 2020-03-15 | Disposition: A | Discharge status: Home / Self Care | Payer: Medicare HMO | Source: Ambulatory Visit | Attending: Radiation Oncology | Admitting: Radiation Oncology

## 2020-03-15 DIAGNOSIS — Z510 Encounter for antineoplastic radiation therapy: Secondary | ICD-10-CM | POA: Diagnosis not present

## 2020-03-19 ENCOUNTER — Ambulatory Visit
Admission: RE | Admit: 2020-03-19 | Discharge: 2020-03-19 | Disposition: A | Discharge status: Home / Self Care | Payer: Medicare HMO | Source: Ambulatory Visit | Attending: Radiation Oncology | Admitting: Radiation Oncology

## 2020-03-19 ENCOUNTER — Inpatient Hospital Stay: Payer: Medicare HMO

## 2020-03-19 ENCOUNTER — Ambulatory Visit: Admission: RE | Admit: 2020-03-19 | Payer: Medicare HMO | Source: Ambulatory Visit

## 2020-03-19 DIAGNOSIS — Z510 Encounter for antineoplastic radiation therapy: Secondary | ICD-10-CM | POA: Diagnosis not present

## 2020-03-20 ENCOUNTER — Ambulatory Visit: Payer: Medicare HMO

## 2020-04-17 ENCOUNTER — Ambulatory Visit: Payer: Medicare HMO

## 2020-04-24 ENCOUNTER — Ambulatory Visit
Admission: RE | Admit: 2020-04-24 | Discharge: 2020-04-24 | Disposition: A | Discharge status: Home / Self Care | Payer: Medicare HMO | Source: Ambulatory Visit | Attending: Radiation Oncology | Admitting: Radiation Oncology

## 2020-04-24 ENCOUNTER — Inpatient Hospital Stay: Payer: Medicare HMO | Attending: Oncology

## 2020-04-24 ENCOUNTER — Encounter: Payer: Self-pay | Admitting: Radiation Oncology

## 2020-04-24 VITALS — BP 128/61 | HR 63 | Temp 96.6°F | Wt 231.0 lb

## 2020-04-24 DIAGNOSIS — C541 Malignant neoplasm of endometrium: Secondary | ICD-10-CM

## 2020-04-24 NOTE — Progress Notes (Signed)
Survivorship Care Plan visit completed.  Treatment summary reviewed and given to patient.  ASCO answers booklet reviewed and given to patient.  CARE program and Cancer Transitions discussed with patient along with other resources cancer center offers to patients and caregivers.  Patient verbalized understanding.    

## 2020-04-24 NOTE — Progress Notes (Signed)
Radiation Oncology Follow up Note  Name: Bridget Murphy   Date:   04/24/2020 MRN:  [id number] DOB: [birth year]    This 75 y.o. female presents to the clinic today for 1 month follow-up status post both external beam radiation therapy as well as vaginal brachytherapy for high intermediate grade stage II endometrial carcinoma status post TAH/BSO.  REFERRING PROVIDER: Lynnell Jude, MD  HPI: Patient is a 76 year old female now at 1 month having completed both external beam radiation therapy to her pelvis as well as vaginal brachytherapy for stage II grade 2-3 endometrioid carcinoma status post TAH/BSO seen today in routine follow-up she is doing well she did not receive chemotherapy.  She specifically denies diarrhea dysuria or any other lower urinary tract symptoms or fatigue..  COMPLICATIONS OF TREATMENT: none  FOLLOW UP COMPLIANCE: keeps appointments   PHYSICAL EXAM:  BP 128/61   Pulse 63   Temp (!) 96.6 F (35.9 C) (Tympanic)   Wt 231 lb (104.8 kg)   BMI 37.28 kg/m  Well-developed well-nourished patient in NAD. HEENT reveals PERLA, EOMI, discs not visualized.  Oral cavity is clear. No oral mucosal lesions are identified. Neck is clear without evidence of cervical or supraclavicular adenopathy. Lungs are clear to A&P. Cardiac examination is essentially unremarkable with regular rate and rhythm without murmur rub or thrill. Abdomen is benign with no organomegaly or masses noted. Motor sensory and DTR levels are equal and symmetric in the upper and lower extremities. Cranial nerves II through XII are grossly intact. Proprioception is intact. No peripheral adenopathy or edema is identified. No motor or sensory levels are noted. Crude visual fields are within normal range.  RADIOLOGY RESULTS: CT scan has been ordered for approximately 6 months  PLAN: Present time patient is doing well low side effect profile status post external beam radiation and vaginal brachytherapy.  I am pleased with her  overall progress I have asked to see her back in 4 to 5 months for follow-up.  We will review her CT scans when it becomes available.  She continues close follow-up care with GYN oncology.  Patient knows to call with any concerns.    Noreene Filbert, MD

## 2020-05-29 ENCOUNTER — Inpatient Hospital Stay: Payer: Medicare HMO | Attending: Obstetrics and Gynecology | Admitting: Obstetrics and Gynecology

## 2020-05-29 VITALS — BP 129/70 | HR 61 | Temp 98.3°F | Resp 20 | Wt 233.0 lb

## 2020-05-29 DIAGNOSIS — I5032 Chronic diastolic (congestive) heart failure: Secondary | ICD-10-CM | POA: Diagnosis not present

## 2020-05-29 DIAGNOSIS — Z923 Personal history of irradiation: Secondary | ICD-10-CM | POA: Diagnosis not present

## 2020-05-29 DIAGNOSIS — M3214 Glomerular disease in systemic lupus erythematosus: Secondary | ICD-10-CM | POA: Insufficient documentation

## 2020-05-29 DIAGNOSIS — Z87891 Personal history of nicotine dependence: Secondary | ICD-10-CM | POA: Diagnosis not present

## 2020-05-29 DIAGNOSIS — I2510 Atherosclerotic heart disease of native coronary artery without angina pectoris: Secondary | ICD-10-CM | POA: Insufficient documentation

## 2020-05-29 DIAGNOSIS — E785 Hyperlipidemia, unspecified: Secondary | ICD-10-CM | POA: Diagnosis not present

## 2020-05-29 DIAGNOSIS — Z79899 Other long term (current) drug therapy: Secondary | ICD-10-CM | POA: Diagnosis not present

## 2020-05-29 DIAGNOSIS — C541 Malignant neoplasm of endometrium: Secondary | ICD-10-CM | POA: Diagnosis not present

## 2020-05-29 DIAGNOSIS — I130 Hypertensive heart and chronic kidney disease with heart failure and stage 1 through stage 4 chronic kidney disease, or unspecified chronic kidney disease: Secondary | ICD-10-CM | POA: Diagnosis not present

## 2020-05-29 DIAGNOSIS — J449 Chronic obstructive pulmonary disease, unspecified: Secondary | ICD-10-CM | POA: Diagnosis not present

## 2020-05-29 DIAGNOSIS — Z90722 Acquired absence of ovaries, bilateral: Secondary | ICD-10-CM | POA: Diagnosis not present

## 2020-05-29 DIAGNOSIS — I219 Acute myocardial infarction, unspecified: Secondary | ICD-10-CM | POA: Diagnosis not present

## 2020-05-29 DIAGNOSIS — Z6837 Body mass index (BMI) 37.0-37.9, adult: Secondary | ICD-10-CM | POA: Insufficient documentation

## 2020-05-29 DIAGNOSIS — Z90710 Acquired absence of both cervix and uterus: Secondary | ICD-10-CM | POA: Insufficient documentation

## 2020-05-29 NOTE — Progress Notes (Signed)
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 female who is seen in consultation from Dr. Georgianne Fick for high-grade endometrial adenocarcinoma.  Doing well and here for surveillance.   She was accompanied to this appointment by Denyse Dago. Complains of some urinary leakage and urge incontinence that is new since radiation.   Gynecologic Oncology History Bridget Murphy is a pleasant female who is seen in consultation from Dr. Georgianne Fick for high-grade endometrial adenocarcinoma and postmenopausal bleeding. Please see prior notes for complete details.  Pelvic US 10/23/2019 The uterus is anteverted and measures 11.3 x 7.0 x 6.0 cm. Echo texture is heterogenous with evidence of focal masses. Within the uterus There are possibly submucosal fibroids vs. Polyps measuring 25.2 x 21.6 x 31.3 mm, 16.7 x 8.3 x 13.8 mm, 12.9 x 8.9 x 12.3 mm. There is also a thick endometrium. The lining in the cervical canal is thick also.   The Endometrium measures 29.0 mm. No IUD is seen.   Right Ovary measures 3.2 x 2.1 x 1.7 cm. It is normal in appearance. There is a follicle in the right ovary measuring 12.0 x 7.9 x 11.0 mm Left Ovary measures 2.5 x 2.2 x 1.0 cm. It is normal in appearance. Survey of the adnexa demonstrates no adnexal masses. There is no free fluid in the cul de sac.  Impression: 1. There are possible fibroids and or polyps within the endometrium canal.  2. The endometrial lining appears thick, as well as, the cervical canal lining.  3. The ovaries appear normal.  4. No IUD is seen.   10/23/2019 Endometrial biopsy performed   A. ENDOMETRIUM, BIOPSY:  - High-grade endometrial adenocarcinoma, FIGO grade 3.  - The carcinoma has mostly endometrioid features.    Preop CT CA/P  11/06/2019:  IMPRESSION: 1. No evidence of metastatic disease within the chest, abdomen, or pelvis. 2. Known endometrial thickening is not well appreciated on this noncontrast examination. 3. Extensive coronary artery calcification. Left anterior descending coronary artery stenting has been performed.  On 11/30/2019 she underwent Exam under anesthesia, Diagnostic laparoscopy, Robot-assisted total laparoscopic hysterectomy, Bilateral salpingo-oophorectomy, Sentinel lymph node mapping, Bilateral pelvic sentinel lymph node biopsy, and Omental biopsy.  DIAGNOSIS  A.  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 Negative for lymphatic vascular space invasion.  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  Right ovary and fallopian tube: Negative for malignancy. Left ovary and fallopian tube: Negative for malignancy.  Microsatellite instability and mismatch repair protein immunohistochemistry: Pending, paraffin block A2, to be reported separately from the molecular diagnostics laboratory.  See synoptic report below.   B.  Right obturator, primary sentinel node, biopsy: One lymph node, negative for malignancy (0/1).   C.  Left external iliac artery sentinel lymph node, biopsy: One lymph node, negative for malignancy (0/1).   D.  Left obturator, primary sentinel lymph node, biopsy: One lymph node, negative for malignancy (0/1).   E.  Left large obturator lymph node, biopsy: One lymph node, negative for metastatic carcinoma (0/1).. See comment.  Comment: Because of the large size of this lymph node (4.2 cm), it will be referred to our  hematopathology division for additional review.   An addendum will follow.   F.  Left  internal iliac, biopsy: One lymph node, negative for malignancy (0/1).   G.  Omentum, omentectomy: Omentum, negative for malignancy.     Electronically signed by Rush Barer, MD on 12/11/2019 at 1559  Addendum 2  This part of hematopathology consultation (specimen E) has been reviewed in our weekly hematopathology QA conference, attended by Drs. Alvera Novel, ASL, JN, SW and EC besides me, and the interpretation and diagnosis rendered above reflect the consensus of the discussion among the attendee.  Addendum electronically signed by Lynnae Sandhoff, MD on 12/18/2019 at [place]  Regarding the enlarged lymph node (left obturator, specimen E), microscopic examination reveals profiles of lymph node with somewhat preserved normal nodal architecture. The cellular constituents appear somewhat disorganized, with slightly expanded sinuses and abundant pink fibrillar hyaline material (with focal calcification) scattered throughout the node.  A panel of immunohistochemical stains was performed after review of H&E slides, on block E2, to assess the lymph node, with appropriate positive and negative controls:  IHC Result  CD3/CD20 Highlights small mature B-cells (follicles) and T-cells in appropriate distributions.  BCL2 Negative in germinal centers. Positive in scattered paracortical cells.  BCL6 Highlights germinal centers.  Ki67 Appropriately elevated in germinal centers, which appear well-defined with retained polarity. Otherwise very low proliferation index.  Kappa/Lambda Light chain expression appears polytypic.  S100 Essentially negative.  CD68 Highlights increased sinus histiocytes.   Taken together, the morphologic and immunophenotypic findings are consistent with reactive follicular hyperplasia with sinus histiocytosis. There is no histopathologic evidence of lymphoproliferative disorder. The etiology of the abundant sclerotic change with focal calcification is  unclear.    [place]: Diagnostic Interpretation A ATYPICAL/INCONCLUSIVE. Marland Kitchen Rare atypical cells, too few to classify, see COMMENT. COMMENT: The cell block is paucicellular and does not contain the cells of interest, precluding further evaluation.  DMMR/MSI status  Mismatch repair: INTACT A. Immunohistochemistry: Normal result. Expression of MLH1, MSH2, MSH6, and PMS2 is retained B. PCR: Microsatellite stable (MSS)  P53 immunohistochemistry (block A1):   Negative (wild type pattern).  Tumor Board recommendations:  Recommendations: WPRT + VBT vs 3 cycles carboplatin and paclitaxel +VBT (GOG249 regimen), vs EBRT/cisplatin + 4 cycles carboplatin and paclitaxel (PORTEC3 regimen) vs consider VBT alone (not first choice due to grade, tumor size, deep invasion, and cervical stromal invasion)    She also has significant medical history with h/o MI, CHF, COPD, stage 3 chronic kidney disease, and elevated BMI.   Tumor Board recommendation was for 3 cycles of chemotherapy (p53 wild-type). Dr. Tasia Catchings felt that given her multiple medical problems, including HTN, HLD, CAD, history of MI in 2012, LVEF 50%, CKD, COPD, lupus nephritis, morbid obesity, PS 2-3, the recommendation was for adjuvant radiation, without adjuvant chemotherapy. Her PS seemed to be improving and Dr Theora Gianotti discussed adding chemotherapy and reviewed the goal of chemotherapy to reduce her recurrence risk, but that chemotherapy is associated with risks that may negatively affect her quality of life. She was not interested in chemotherapy. Her PS was 2. While she is able to make her own meals she is in a chair >50% of the day.   She finished radiation treatments with external pelvic RT and vaginal brachytherapy with Dr. Baruch Gouty 1/22 .    Germline testing: not indicated  Problem List: Patient Active Problem List   Diagnosis Date Noted  . Goals of care, counseling/discussion 01/27/2020  . Endometrial adenocarcinoma ([place]) 11/01/2019  .  Bilateral chronic knee pain 04/04/2019  . Ds DNA antibody positive 04/04/2019  . Lymphedema of both lower extremities 04/04/2019  . Hyperparathyroidism due to renal insufficiency ([place]) 02/19/2019  . Acute gout involving toe 09/28/2016  . Systemic lupus erythematosus ([place]) 09/28/2016  . Chronic diastolic CHF (congestive heart failure) ([place]) 09/11/2015  . Stage 3 chronic kidney disease ([place]) 03/11/2015  . Acute myocardial infarction, unspecified ([place]) 10/04/2013  . Essential hypertension 10/04/2013  . Hyperlipidemia 10/04/2013  . Morbid obesity ([place]) 10/04/2013  . Other specified postprocedural states 10/04/2013    Past Medical History: Past Medical History:  Diagnosis Date  . Acute myocardial infarction [facility])    s/p three stents; anterior STEMI, DES LAD 09/25/2010  . Arthritis   . Chronic kidney disease   . COPD (chronic obstructive pulmonary disease) ([place])   . Hyperlipidemia   . Hypertension     Past Surgical History: Past Surgical History:  Procedure Laterality Date  . HYSTERECTOMY ABDOMINAL WITH SALPINGO-OOPHORECTOMY  11/30/2019    Exam under anesthesia, Diagnostic laparoscopy, Robot-assisted total laparoscopic hysterectomy, Bilateral salpingo-oophorectomy, Sentinel lymph node mapping, Bilateral pelvic sentinel lymph node biopsy, and Omental biopsy.    Past Gynecologic History:  As per HPI  OB History:  OB History  Gravida Para Term Preterm AB Living  1 1          SAB IAB Ectopic Multiple Live Births               # Outcome Date GA Lbr Len/2nd Weight Sex Delivery Anes PTL Lv  1 Para             Obstetric Comments  SVD x 1    Family History: Family History  Problem Relation Age of Onset  . Cervical cancer Mother   . Cervical cancer Sister   . Prostate cancer Brother   . Prostate cancer Brother   . Prostate cancer Brother    Immunization History  Administered Date(s) Administered  . Moderna Sars-Covid-2 Vaccination 05/31/2019, 06/24/2019   Social  History: Social History   Socioeconomic History  . Marital status: Widowed    Spouse name: Not on file  . Number of children: Not on file  . Years of education: Not on file  . Highest education level: Not on file  Occupational History  . Not on file  Tobacco Use  . Smoking status: Former Smoker    Packs/day: 0.50    Quit date: 12/25/2010    Years since quitting: 9.4  . Smokeless tobacco: Never Used  Vaping Use  . Vaping Use: Never used  Substance and Sexual Activity  . Alcohol use: Not Currently  . Drug use: Never  . Sexual activity: Not on file  Other Topics Concern  . Not on file  Social History Narrative  . Not on file   Social Determinants of Health   Financial Resource Strain: Not on file  Food Insecurity: Not on file  Transportation Needs: Not on file  Physical Activity: Not on file  Stress: Not on file  Social Connections: Not on file  Intimate Partner Violence: Not on file    Allergies: Allergies  Allergen Reactions  . Acetaminophen Other (See Comments)    "Heart fluttering"  . Furosemide Rash    Current Medications: Current Outpatient Medications  Medication Sig Dispense Refill  . allopurinol (ZYLOPRIM) 100 MG tablet Take 50 mg by mouth daily.     Marland Kitchen aspirin 81 MG EC tablet Take 81 mg by mouth.     Marland Kitchen  atorvastatin (LIPITOR) 40 MG tablet Take by mouth.    . calcium elemental as carbonate (TUMS ULTRA 1000) 400 MG chewable tablet Chew by mouth daily as needed.     . clopidogrel (PLAVIX) 75 MG tablet     . diclofenac Sodium (VOLTAREN) 1 % GEL Apply topically.    . furosemide (LASIX) 20 MG tablet     . ibuprofen (ADVIL) 100 MG/5ML suspension Take 200 mg by mouth every 4 (four) hours as needed.    Marland Kitchen lisinopril-hydrochlorothiazide (ZESTORETIC) 10-12.5 MG tablet Take by mouth.    . metoprolol succinate (TOPROL-XL) 50 MG 24 hr tablet Take by mouth.    . nitroGLYCERIN (NITROSTAT) 0.4 MG SL tablet     . nystatin (MYCOSTATIN/NYSTOP) powder Apply 1 application  topically 2 (two) times daily as needed. 60 g 0  . oxyCODONE (OXY IR/ROXICODONE) 5 MG immediate release tablet Take 5 mg by mouth every 4 (four) hours as needed.     . polyethylene glycol powder (GLYCOLAX/MIRALAX) 17 GM/SCOOP powder Take 0.5 Containers by mouth daily.     Marland Kitchen senna-docusate (SENOKOT-S) 8.6-50 MG tablet Take by mouth.    . silver sulfADIAZINE (SILVADENE) 1 % cream Apply 1 application topically daily. 85 g 2   No current facility-administered medications for this visit.    Review of Systems General: no complaints  HEENT: no complaints  Lungs: no complaints  Cardiac: no complaints  GI: diarrhea 1-2 times a day  GU: occasional urinary incontinence  Musculoskeletal: no complaints  Extremities: chronic leg swelling o/w no complaints  Skin: no complaints  Neuro: no complaints  Endocrine: no complaints  Psych: no complaints      Objective:  Physical Examination:  BP 129/70   Pulse 61   Temp 98.3 F (36.8 C)   Resp 20   Wt 233 lb (105.7 kg)   SpO2 99%   BMI 37.61 kg/m   ECOG Performance status: 2   GENERAL: Patient is elderly female in no acute distress. Travels in wheel chair.  HEENT:  PERRL, HEENT NODES:  No inguinal lymphadenopathy palpated.  ABDOMEN:  Soft, nontender, nondistended. No hernias/ascites/masses.   EXTREMITIES:  stable peripheral edema.   SKIN:  Erythema and desquamation under the pannus NEURO:  Nonfocal. Well oriented.  Appropriate affect.  Pelvic: Chaperoned by nursing.  EGBUS: no lesions Cervix: surgically absent Vagina: no lesions, no discharge or bleeding; cuff healed well now.   Uterus: surgically absent Adnexa: surgically absent BME: no palpable masses; vaginal cuff intact Rectovaginal: normal  Extremities: swollen bilaterally  Lab Review Lab Results  Component Value Date   WBC 5.1 02/08/2020   HGB 8.7 (L) 02/08/2020   HCT 28.2 (L) 02/08/2020   MCV 85.7 02/08/2020   PLT 198 02/08/2020     Chemistry      Component Value  Date/Time   NA 137 02/08/2020 0844   K 3.8 02/08/2020 0844   CL 103 02/08/2020 0844   CO2 25 02/08/2020 0844   BUN 26 (H) 02/08/2020 0844   CREATININE 1.17 (H) 02/08/2020 0844      Component Value Date/Time   CALCIUM 8.6 (L) 02/08/2020 0844   ALKPHOS 58 02/08/2020 0844   AST 20 02/08/2020 0844   ALT 10 02/08/2020 0844   BILITOT 0.5 02/08/2020 0844     12/01/2019 Creatinine 1.8  Albumin 2.6  Radiologic Imaging: As noted   Assessment:  Bridget Murphy is a 75 y.o. female diagnosed with stage II grade 2 endometrioid endometrial cancer (MSS, pMMR, p53 wildtype) with  cervical stromal invasion and atypical inconclusive washings.  On 11/30/2019 she robot-assisted total laparoscopic hysterectomy, Bilateral salpingo-oophorectomy, Sentinel lymph node mapping, Bilateral pelvic sentinel lymph node biopsy, and Omental biopsy.   Slow vaginal cuff healing, thought due to hypoalbuminemia, delayed adjuvant therapy.   Tumor Board recommendation was for 3 cycles of chemotherapy (p53 wild-type). Dr. Tasia Catchings felt that given her multiple medical problems, including HTN, HLD, CAD, history of MI in 2012, LVEF 50%, CKD, COPD, lupus nephritis, morbid obesity, PS 2-3, the recommendation was for adjuvant radiation, without adjuvant chemotherapy. Her PS seemed to be improving and Dr Theora Gianotti discussed adding chemotherapy and reviewed the goal of chemotherapy to reduce her recurrence risk, but that chemotherapy is associated with risks that may negatively affect her quality of life. She was not interested in chemotherapy. Her PS was 2. While she is able to make her own meals she is in a chair >50% of the day.   She finished radiation treatments with external pelvic RT and vaginal brachytherapy with Dr. Baruch Gouty 1/22 .   Elevated creatinine with known history of CKD. Urinary urgency and incontinence since started radiation.    Leg swelling stable  Medical co-morbidities complicating care: HTN and obesity, prior MI s/p 3 stents  on Plavix, h/o CHF, chronic kidney disease, and COPD.  Plan:   Problem List Items Addressed This Visit      Genitourinary   Endometrial adenocarcinoma ([place]) - Primary     Suggested return to clinic in 3 months with Dr Baruch Gouty and with [place] in 6 months for surveillance exam.    The patient's diagnosis, an outline of the further diagnostic and laboratory studies which will be required, the recommendation, and alternatives were discussed.  All questions were answered to the patient's satisfaction.  Mellody Drown, MD  I personally interviewed and examined the patient. Agreed with the above/below plan of care. I have directly contributed to assessment and plan of care of this patient and educated and discussed with patient and family.  Mellody Drown, MD  CC:  Malachy Mood, MD [address] [place],  [place] [address] [phone]

## 2020-09-19 ENCOUNTER — Ambulatory Visit
Admission: RE | Admit: 2020-09-19 | Discharge: 2020-09-19 | Disposition: A | Discharge status: Home / Self Care | Payer: Medicare HMO | Source: Ambulatory Visit | Attending: Radiation Oncology | Admitting: Radiation Oncology

## 2020-09-19 ENCOUNTER — Inpatient Hospital Stay: Payer: Medicare HMO | Attending: Oncology

## 2020-09-19 ENCOUNTER — Other Ambulatory Visit: Payer: Self-pay

## 2020-09-19 ENCOUNTER — Encounter: Payer: Self-pay | Admitting: Radiation Oncology

## 2020-09-19 VITALS — BP 123/42 | HR 55 | Temp 97.1°F | Wt 236.0 lb

## 2020-09-19 DIAGNOSIS — Z923 Personal history of irradiation: Secondary | ICD-10-CM | POA: Insufficient documentation

## 2020-09-19 DIAGNOSIS — Z90710 Acquired absence of both cervix and uterus: Secondary | ICD-10-CM | POA: Diagnosis not present

## 2020-09-19 DIAGNOSIS — Z90722 Acquired absence of ovaries, bilateral: Secondary | ICD-10-CM | POA: Insufficient documentation

## 2020-09-19 DIAGNOSIS — C541 Malignant neoplasm of endometrium: Secondary | ICD-10-CM | POA: Diagnosis present

## 2020-09-19 NOTE — Progress Notes (Signed)
Radiation Oncology Follow up Note  Name: Bridget Murphy   Date:   09/19/2020 MRN:  [id number] DOB: [birth year]    This 75 y.o. female presents to the clinic today for 66-monthfollow-up status post pelvic radiation as well as brachytherapy for stage II high-grade endometrioid endometrial adenocarcinoma  REFERRING PROVIDER: BLynnell Jude MD  HPI: Patient is a 75year old female.  Status post laparoscopic hysterectomy and BSO for endometrioid adenocarcinoma of the endometrium FIGO grade 2 or 3 invading 17 mm of a 21 mm thick myometrium.  Based on her multiple medical comorbidities in general conditions chemotherapy was not performed.  She underwent external beam pelvic radiation therapy as well as vaginal brachytherapy.  Seen today she is doing well specifically denies any increased lower urinary tract symptoms diarrhea fatigue or vaginal discharge.  COMPLICATIONS OF TREATMENT: none  FOLLOW UP COMPLIANCE: keeps appointments   PHYSICAL EXAM:  BP (!) 123/42   Pulse (!) 55   Temp (!) 97.1 F (36.2 C) (Tympanic)   Wt 236 lb (107 kg)   SpO2 100%   BMI 38.09 kg/m  Well-developed well-nourished patient in NAD. HEENT reveals PERLA, EOMI, discs not visualized.  Oral cavity is clear. No oral mucosal lesions are identified. Neck is clear without evidence of cervical or supraclavicular adenopathy. Lungs are clear to A&P. Cardiac examination is essentially unremarkable with regular rate and rhythm without murmur rub or thrill. Abdomen is benign with no organomegaly or masses noted. Motor sensory and DTR levels are equal and symmetric in the upper and lower extremities. Cranial nerves II through XII are grossly intact. Proprioception is intact. No peripheral adenopathy or edema is identified. No motor or sensory levels are noted. Crude visual fields are within normal range.  RADIOLOGY RESULTS: No current films to review  PLAN: Present time she is doing well with very low side effect profile status post  external beam radiation as well as vaginal brachytherapy and pleased with her overall progress.  She is to see Dr. BFransisca Connorsin October.  I have asked to see her back in 6 months for follow-up.  Patient knows to call with any concerns.  I would like to take this opportunity to thank you for allowing me to participate in the care of your patient..Noreene Filbert MD

## 2020-12-04 ENCOUNTER — Inpatient Hospital Stay: Payer: Medicare HMO | Attending: Obstetrics and Gynecology | Admitting: Nurse Practitioner

## 2020-12-04 VITALS — BP 121/70 | HR 61 | Temp 97.7°F | Resp 16 | Wt 242.2 lb

## 2020-12-04 DIAGNOSIS — Z923 Personal history of irradiation: Secondary | ICD-10-CM | POA: Diagnosis not present

## 2020-12-04 DIAGNOSIS — N183 Chronic kidney disease, stage 3 unspecified: Secondary | ICD-10-CM | POA: Insufficient documentation

## 2020-12-04 DIAGNOSIS — I2510 Atherosclerotic heart disease of native coronary artery without angina pectoris: Secondary | ICD-10-CM | POA: Diagnosis not present

## 2020-12-04 DIAGNOSIS — Z8542 Personal history of malignant neoplasm of other parts of uterus: Secondary | ICD-10-CM

## 2020-12-04 DIAGNOSIS — N8003 Adenomyosis of the uterus: Secondary | ICD-10-CM | POA: Diagnosis not present

## 2020-12-04 DIAGNOSIS — Z08 Encounter for follow-up examination after completed treatment for malignant neoplasm: Secondary | ICD-10-CM | POA: Diagnosis not present

## 2020-12-04 DIAGNOSIS — I252 Old myocardial infarction: Secondary | ICD-10-CM | POA: Insufficient documentation

## 2020-12-04 DIAGNOSIS — E785 Hyperlipidemia, unspecified: Secondary | ICD-10-CM | POA: Diagnosis not present

## 2020-12-04 DIAGNOSIS — Z79899 Other long term (current) drug therapy: Secondary | ICD-10-CM | POA: Insufficient documentation

## 2020-12-04 DIAGNOSIS — J449 Chronic obstructive pulmonary disease, unspecified: Secondary | ICD-10-CM | POA: Insufficient documentation

## 2020-12-04 DIAGNOSIS — D251 Intramural leiomyoma of uterus: Secondary | ICD-10-CM | POA: Insufficient documentation

## 2020-12-04 DIAGNOSIS — N764 Abscess of vulva: Secondary | ICD-10-CM | POA: Insufficient documentation

## 2020-12-04 DIAGNOSIS — R944 Abnormal results of kidney function studies: Secondary | ICD-10-CM | POA: Diagnosis not present

## 2020-12-04 DIAGNOSIS — I5032 Chronic diastolic (congestive) heart failure: Secondary | ICD-10-CM | POA: Insufficient documentation

## 2020-12-04 DIAGNOSIS — I509 Heart failure, unspecified: Secondary | ICD-10-CM | POA: Diagnosis not present

## 2020-12-04 DIAGNOSIS — C541 Malignant neoplasm of endometrium: Secondary | ICD-10-CM | POA: Diagnosis present

## 2020-12-04 DIAGNOSIS — I130 Hypertensive heart and chronic kidney disease with heart failure and stage 1 through stage 4 chronic kidney disease, or unspecified chronic kidney disease: Secondary | ICD-10-CM | POA: Diagnosis not present

## 2020-12-04 DIAGNOSIS — L304 Erythema intertrigo: Secondary | ICD-10-CM

## 2020-12-04 DIAGNOSIS — B372 Candidiasis of skin and nail: Secondary | ICD-10-CM | POA: Diagnosis not present

## 2020-12-04 DIAGNOSIS — M3214 Glomerular disease in systemic lupus erythematosus: Secondary | ICD-10-CM | POA: Diagnosis not present

## 2020-12-04 DIAGNOSIS — I219 Acute myocardial infarction, unspecified: Secondary | ICD-10-CM | POA: Insufficient documentation

## 2020-12-04 MED ORDER — NYSTATIN 100000 UNIT/GM EX POWD: 1.0000 "application " | Freq: Three times a day (TID) | CUTANEOUS | 3 refills | Status: DC

## 2020-12-04 NOTE — Progress Notes (Signed)
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 female who is seen in consultation from Dr. Georgianne Fick for high-grade endometrial adenocarcinoma.  She has a 'boil' on her left upper labia. She has been applying potato poultices with some improvement and applying cream from Dr. Baruch Gouty that she used during radiation. She has ongoing leg swelling related to her history of heart failure. She is accompanied by [place] today. Urinary     She was accompanied to this appointment by [facility]. Complains of some urinary leakage and urge incontinence that is new since radiation.   Gynecologic Oncology History Bridget Murphy is a pleasant female who is seen in consultation from Dr. Georgianne Fick for high-grade endometrial adenocarcinoma and postmenopausal bleeding. Please see prior notes for complete details.   Pelvic US 10/23/2019 The uterus is anteverted and measures 11.3 x 7.0 x 6.0 cm. Echo texture is heterogenous with evidence of focal masses. Within the uterus There are possibly submucosal fibroids vs. Polyps measuring 25.2 x 21.6 x 31.3 mm, 16.7 x 8.3 x 13.8 mm, 12.9 x 8.9 x 12.3 mm. There is also a thick endometrium. The lining in the cervical canal is thick also.    The Endometrium measures 29.0 mm. No IUD is seen.    Right Ovary measures 3.2 x 2.1 x 1.7 cm. It is normal in appearance. There is a follicle in the right ovary measuring 12.0 x 7.9 x 11.0 mm Left Ovary measures 2.5 x 2.2 x 1.0 cm. It is normal in appearance. Survey of the adnexa demonstrates no adnexal masses. There is no free fluid in the cul de sac.   Impression: 1. There are possible fibroids and or polyps within the endometrium canal.  2. The endometrial lining appears thick, as well as, the cervical canal lining.   3. The ovaries appear normal.  4. No IUD is seen.    10/23/2019 Endometrial biopsy performed   A. ENDOMETRIUM, BIOPSY:  - High-grade endometrial adenocarcinoma, FIGO grade 3.  - The carcinoma has mostly endometrioid features.    Preop CT CA/P 11/06/2019:  IMPRESSION: 1. No evidence of metastatic disease within the chest, abdomen, or pelvis. 2. Known endometrial thickening is not well appreciated on this noncontrast examination. 3. Extensive coronary artery calcification. Left anterior descending coronary artery stenting has been performed.  On 11/30/2019 she underwent Exam under anesthesia, Diagnostic laparoscopy, Robot-assisted total laparoscopic hysterectomy, Bilateral salpingo-oophorectomy, Sentinel lymph node mapping, Bilateral pelvic sentinel lymph node biopsy, and Omental biopsy.  DIAGNOSIS  A.  Uterus, bilateral ovaries and fallopian tubes, hysterectomy and bilateral salpingo-oophorectomy:   Endometrioid adenocarcinoma of the endometrium (9 cm), FIGO grade 2 of 3, invading 17 mm in a 21 mm thick myometrium, with stromal invasion of the cervix. Negative for lymphatic vascular space invasion.  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  Right ovary and fallopian tube: Negative for malignancy. Left ovary and fallopian tube: Negative for malignancy.  Microsatellite instability and mismatch repair protein immunohistochemistry: Pending, paraffin block A2, to be reported separately from the molecular diagnostics laboratory.  See synoptic report below.  B.  Right obturator, primary sentinel node, biopsy: One lymph node, negative for malignancy (0/1).  C.  Left external iliac artery sentinel lymph node, biopsy: One lymph node, negative for malignancy (0/1).  D.  Left obturator, primary sentinel lymph node,  biopsy: One lymph node, negative for malignancy (0/1).  E.  Left large obturator lymph node, biopsy: One lymph node,  negative for metastatic carcinoma (0/1).. See comment.  Comment: Because of the large size of this lymph node (4.2 cm), it will be referred to our hematopathology division for additional review.   An addendum will follow.    F.  Left internal iliac, biopsy: One lymph node, negative for malignancy (0/1).  G.  Omentum, omentectomy: Omentum, negative for malignancy.   Electronically signed by Rush Barer, MD on 12/11/2019 at 1559  Addendum 2  This part of hematopathology consultation (specimen E) has been reviewed in our weekly hematopathology QA conference, attended by Drs. Alvera Novel, ASL, JN, SW and EC besides me, and the interpretation and diagnosis rendered above reflect the consensus of the discussion among the attendee.  Addendum electronically signed by Lynnae Sandhoff, MD on 12/18/2019 at [place]   Regarding the enlarged lymph node (left obturator, specimen E), microscopic examination reveals profiles of lymph node with somewhat preserved normal nodal architecture. The cellular constituents appear somewhat disorganized, with slightly expanded sinuses and abundant pink fibrillar hyaline material (with focal calcification) scattered throughout the node.   A panel of immunohistochemical stains was performed after review of H&E slides, on block E2, to assess the lymph node, with appropriate positive and negative controls:   IHC Result  CD3/CD20 Highlights small mature B-cells (follicles) and T-cells in appropriate distributions.  BCL2 Negative in germinal centers. Positive in scattered paracortical cells.  BCL6 Highlights germinal centers.  Ki67 Appropriately elevated in germinal centers, which appear well-defined with retained polarity. Otherwise very low proliferation index.  Kappa/Lambda Light chain expression appears polytypic.  S100 Essentially negative.  CD68 Highlights increased sinus histiocytes.    Taken together, the morphologic and  immunophenotypic findings are consistent with reactive follicular hyperplasia with sinus histiocytosis. There is no histopathologic evidence of lymphoproliferative disorder. The etiology of the abundant sclerotic change with focal calcification is unclear.   [place]: Diagnostic Interpretation A ATYPICAL/INCONCLUSIVE.  Rare atypical cells, too few to classify, see COMMENT. COMMENT: The cell block is paucicellular and does not contain the cells of interest, precluding further evaluation.  DMMR/MSI status  Mismatch repair: INTACT Immunohistochemistry: Normal result. Expression of MLH1, MSH2, MSH6, and PMS2 is retained PCR: Microsatellite stable (MSS)  P53 immunohistochemistry (block A1):   Negative (wild type pattern).  Tumor Board recommendations:  Recommendations: WPRT + VBT vs 3 cycles carboplatin and paclitaxel +VBT (GOG249 regimen), vs EBRT/cisplatin + 4 cycles carboplatin and paclitaxel (PORTEC3 regimen) vs consider VBT alone (not first choice due to grade, tumor size, deep invasion, and cervical stromal invasion)    She also has significant medical history with h/o MI, CHF, COPD, stage 3 chronic kidney disease, and elevated BMI.   Tumor Board recommendation was for 3 cycles of chemotherapy (p53 wild-type). Dr. Tasia Catchings felt that given her multiple medical problems, including HTN, HLD, CAD, history of MI in 2012, LVEF 50%, CKD, COPD, lupus nephritis, morbid obesity, PS 2-3, the recommendation was for adjuvant radiation, without adjuvant chemotherapy. Her PS seemed to be improving and Dr Theora Gianotti discussed adding chemotherapy and reviewed the goal of chemotherapy to reduce her recurrence risk, but that chemotherapy is associated with risks that may negatively affect her quality of life. She was not interested in chemotherapy. Her PS was 2. While she is able to make her own meals she is in a chair >50% of the day.   She finished radiation treatments  with external pelvic RT and vaginal brachytherapy  with Dr. Baruch Gouty 1/22 .    Germline testing: not indicated  Problem List: Patient Active Problem List   Diagnosis Date Noted   Goals of care, counseling/discussion 01/27/2020   Endometrial adenocarcinoma ([place]) 11/01/2019   Bilateral chronic knee pain 04/04/2019   Ds DNA antibody positive 04/04/2019   Lymphedema of both lower extremities 04/04/2019   Hyperparathyroidism due to renal insufficiency ([place]) 02/19/2019   Acute gout involving toe 09/28/2016   Systemic lupus erythematosus ([place]) 09/28/2016   Chronic diastolic CHF (congestive heart failure) ([place]) 09/11/2015   Stage 3 chronic kidney disease ([place]) 03/11/2015   Acute myocardial infarction, unspecified ([place]) 10/04/2013   Essential hypertension 10/04/2013   Hyperlipidemia 10/04/2013   Morbid obesity ([place]) 10/04/2013   Other specified postprocedural states 10/04/2013    Past Medical History: Past Medical History:  Diagnosis Date   Acute myocardial infarction [facility])    s/p three stents; anterior STEMI, DES LAD 09/25/2010   Arthritis    Chronic kidney disease    COPD (chronic obstructive pulmonary disease) ([place])    Hyperlipidemia    Hypertension     Past Surgical History: Past Surgical History:  Procedure Laterality Date   HYSTERECTOMY ABDOMINAL WITH SALPINGO-OOPHORECTOMY  11/30/2019    Exam under anesthesia, Diagnostic laparoscopy, Robot-assisted total laparoscopic hysterectomy, Bilateral salpingo-oophorectomy, Sentinel lymph node mapping, Bilateral pelvic sentinel lymph node biopsy, and Omental biopsy.    Past Gynecologic History:  As per HPI  OB History:  OB History  Gravida Para Term Preterm AB Living  1 1          SAB IAB Ectopic Multiple Live Births               # Outcome Date GA Lbr Len/2nd Weight Sex Delivery Anes PTL Lv  1 Para             Obstetric Comments  SVD x 1    Family History: Family History  Problem Relation Age of Onset   Cervical cancer Mother    Cervical cancer Sister    Prostate  cancer Brother    Prostate cancer Brother    Prostate cancer Brother    Immunization History  Administered Date(s) Administered   Moderna Sars-Covid-2 Vaccination 05/31/2019, 06/24/2019   Social History: Social History   Socioeconomic History   Marital status: Widowed    Spouse name: Not on file   Number of children: Not on file   Years of education: Not on file   Highest education level: Not on file  Occupational History   Not on file  Tobacco Use   Smoking status: Former    Packs/day: 0.50    Types: Cigarettes    Quit date: 12/25/2010    Years since quitting: 9.9   Smokeless tobacco: Never  Vaping Use   Vaping Use: Never used  Substance and Sexual Activity   Alcohol use: Not Currently   Drug use: Never   Sexual activity: Not on file  Other Topics Concern   Not on file  Social History Narrative   Not on file   Social Determinants of Health   Financial Resource Strain: Not on file  Food Insecurity: Not on file  Transportation Needs: Not on file  Physical Activity: Not on file  Stress: Not on file  Social Connections: Not on file  Intimate Partner Violence: Not on file    Allergies: Allergies  Allergen Reactions   Acetaminophen Other (See Comments)    "Heart fluttering"  Furosemide Rash    Current Medications: Current Outpatient Medications  Medication Sig Dispense Refill   allopurinol (ZYLOPRIM) 100 MG tablet Take 50 mg by mouth daily.      aspirin 81 MG EC tablet Take 81 mg by mouth.      atorvastatin (LIPITOR) 40 MG tablet Take by mouth.     calcium elemental as carbonate (TUMS ULTRA 1000) 400 MG chewable tablet Chew by mouth daily as needed.      clopidogrel (PLAVIX) 75 MG tablet      diclofenac Sodium (VOLTAREN) 1 % GEL Apply topically.     furosemide (LASIX) 20 MG tablet      ibuprofen (ADVIL) 100 MG/5ML suspension Take 200 mg by mouth every 4 (four) hours as needed.     lisinopril-hydrochlorothiazide (ZESTORETIC) 10-12.5 MG tablet Take by  mouth.     metoprolol succinate (TOPROL-XL) 50 MG 24 hr tablet Take by mouth.     nitroGLYCERIN (NITROSTAT) 0.4 MG SL tablet      nystatin (MYCOSTATIN/NYSTOP) powder Apply 1 application topically 2 (two) times daily as needed. 60 g 0   oxyCODONE (OXY IR/ROXICODONE) 5 MG immediate release tablet Take 5 mg by mouth every 4 (four) hours as needed.      polyethylene glycol powder (GLYCOLAX/MIRALAX) 17 GM/SCOOP powder Take 0.5 Containers by mouth daily.      senna-docusate (SENOKOT-S) 8.6-50 MG tablet Take by mouth.     silver sulfADIAZINE (SILVADENE) 1 % cream Apply 1 application topically daily. 85 g 2   No current facility-administered medications for this visit.   Review of Systems General:  no complaints Skin: no complaints Eyes: no complaints HEENT: no complaints Breasts: no complaints Pulmonary: no complaints Cardiac: no complaints Gastrointestinal: decreased appetite with weight gain Genitourinary/Sexual: no complaints Ob/Gyn: no complaints Musculoskeletal: no complaints Hematology: no complaints Neurologic/Psych: depression  Objective:  Physical Examination:  BP 121/70 (BP Location: Left Arm, Patient Position: Sitting, Cuff Size: Large)   Pulse 61   Temp 97.7 F (36.5 C) (Tympanic)   Resp 16   Wt 242 lb 3.2 oz (109.9 kg)   SpO2 100%   BMI 39.09 kg/m   ECOG Performance status: 2-3   GENERAL: Patient is elderly female in no acute distress. Obese.Travels in wheel chair.  HEENT:  PERRL NODES:  No inguinal lymphadenopathy palpated.  ABDOMEN:  Soft, nontender, nondistended. No hernias/ascites/masses.   EXTREMITIES:  stable peripheral edema.   SKIN:  Erythema and desquamation under the pannus. White film across skin surface predominately over lateral pannus. Midline, skin intact.  NEURO:  Nonfocal. Well oriented.  Appropriate affect.  Pelvic: Chaperoned by CMA. EGBUS: left upper labia at 1:00 hypopigmented, diffusely firm, mildly tender. No erythema or obvious fluctuant  head.. No other lesions identified. Cervix, Uterus, Adnexa: surgically absent. Vagina: no lesions. Tissue atrophic. Vaginal cuff well healed. Some bleeding with exam. BME: no palpable masses. Smooth. Rectovaginal: declined.    Lab Review Lab Results  Component Value Date   WBC 5.1 02/08/2020   HGB 8.7 (L) 02/08/2020   HCT 28.2 (L) 02/08/2020   MCV 85.7 02/08/2020   PLT 198 02/08/2020     Chemistry      Component Value Date/Time   NA 137 02/08/2020 0844   K 3.8 02/08/2020 0844   CL 103 02/08/2020 0844   CO2 25 02/08/2020 0844   BUN 26 (H) 02/08/2020 0844   CREATININE 1.17 (H) 02/08/2020 0844      Component Value Date/Time   CALCIUM 8.6 (L) 02/08/2020 1030  ALKPHOS 58 02/08/2020 0844   AST 20 02/08/2020 0844   ALT 10 02/08/2020 0844   BILITOT 0.5 02/08/2020 0844     12/01/2019 Creatinine 1.8  Albumin 2.6  Radiologic Imaging: As noted   Assessment:  Bridget Murphy is a 75 y.o. female diagnosed with stage II grade 2 endometrioid endometrial cancer (MSS, pMMR, p53 wildtype) with cervical stromal invasion and atypical inconclusive washings.  On 11/30/2019 she robot-assisted total laparoscopic hysterectomy, Bilateral salpingo-oophorectomy, Sentinel lymph node mapping, Bilateral pelvic sentinel lymph node biopsy, and Omental biopsy. Slow vaginal cuff healing, thought due to hypoalbuminemia, delayed adjuvant therapy.   Tumor Board recommendation was for 3 cycles of chemotherapy (p53 wild-type). Dr. Tasia Catchings felt that given her multiple medical problems, including HTN, HLD, CAD, history of MI in 2012, LVEF 50%, CKD, COPD, lupus nephritis, morbid obesity, PS 2-3, the recommendation was for adjuvant radiation, without adjuvant chemotherapy. Her PS seemed to be improving and Dr Theora Gianotti discussed adding chemotherapy and reviewed the goal of chemotherapy to reduce her recurrence risk, but that chemotherapy is associated with risks that may negatively affect her quality of life. She was not interested in  chemotherapy. Her PS was 2. While she is able to make her own meals she is in a chair >50% of the day.   She finished radiation treatments with external pelvic RT and vaginal brachytherapy with Dr. Baruch Gouty 1/22 .   No evidence of recurrent disease today. Clinically asymptomatic.   Elevated creatinine with known history of CKD. Urinary urgency and incontinence secondary to radiation has improved.   Leg swelling stable  Skin candidiasis/intertriginous dermatitis of pannus  Medical co-morbidities complicating care: HTN and obesity, prior MI s/p 3 stents on Plavix, h/o CHF, chronic kidney disease, and COPD.  Plan:   Problem List Items Addressed This Visit   None Visit Diagnoses     Encounter for follow-up surveillance of endometrial cancer    -  Primary      No indication for imaging at this time. She is now 1 year from her surgery. Survivorship topics reviewed. Suggested return to clinic in 4 months with Dr Baruch Gouty and with [place] in 8 months for surveillance exam. She will alternate gyn onc visits with NP and MD. Will see MD for next visit.   Follow up with cardiology for leg swelling   Skin boil- warm compresses, topical antibacterial ointment such as neosporin.   Skin candidiasis- refilled nystatin powder. Keep area clean and dry.   The patient's diagnosis, an outline of the further diagnostic and laboratory studies which will be required, the recommendation, and alternatives were discussed.  All questions were answered to the patient's satisfaction.  Verlon Au, NP  CC:  Malachy Mood, MD [address] [place],  [place] [address] [phone]

## 2021-03-21 IMAGING — US US RENAL
1 series · 14 of 25 positions shown · non-contrast
Comparison: None.

CLINICAL DATA: Dysuria

EXAM:
RENAL / URINARY TRACT ULTRASOUND COMPLETE

[Series 1: us renal · 0.21mm/px · 14 of 40 slices shown]
[im 1/40]
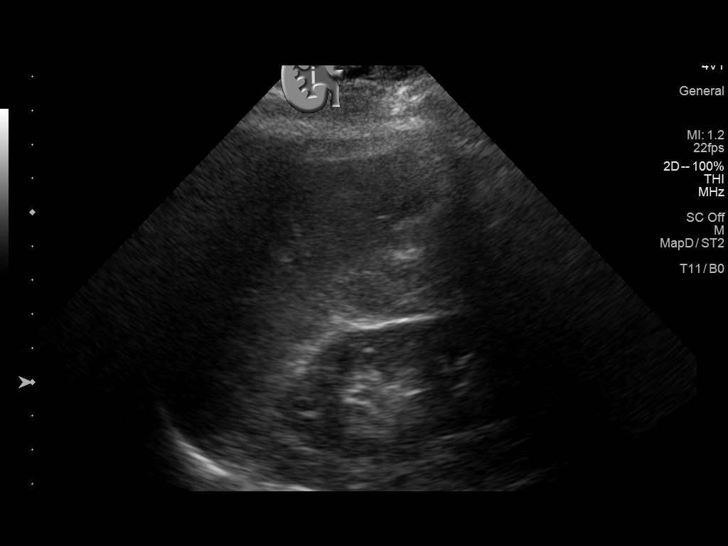
[im 4/40]
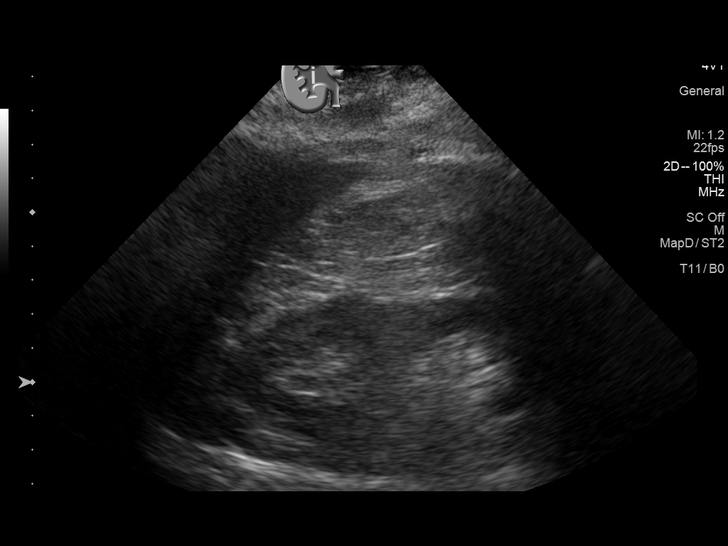
[im 7/40]
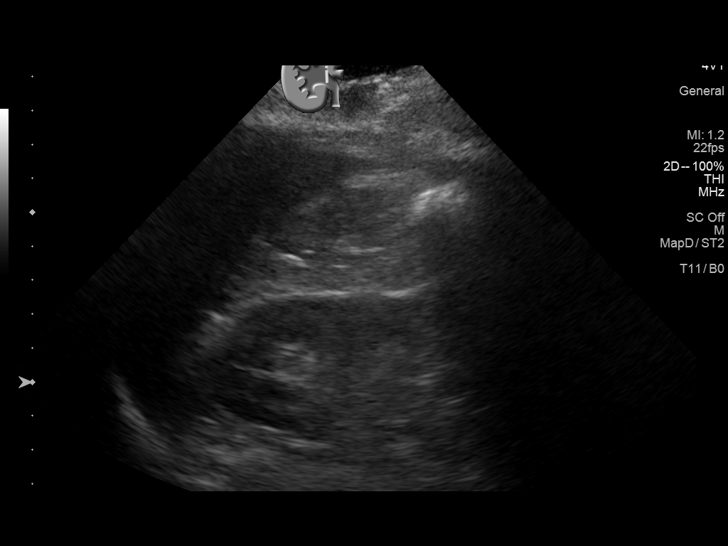
[im 10/40]
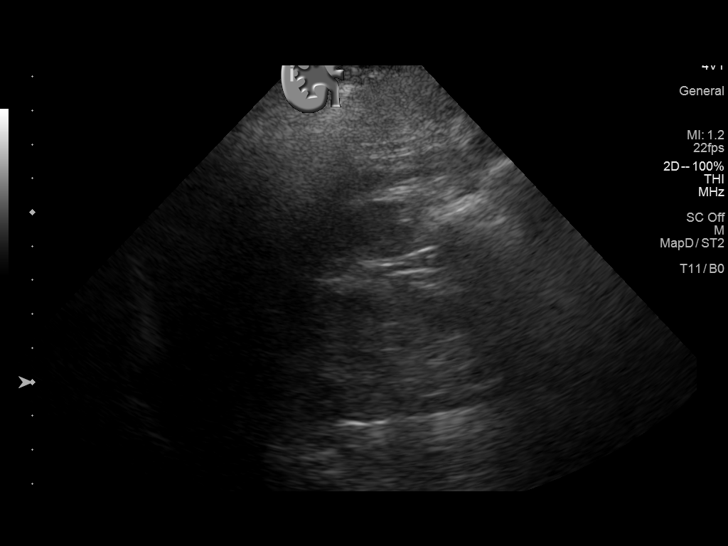
[im 14/40]
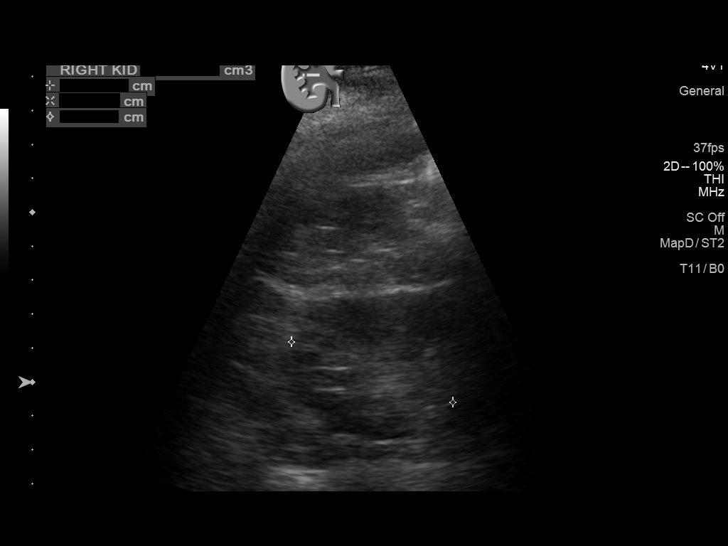
[im 15/40]
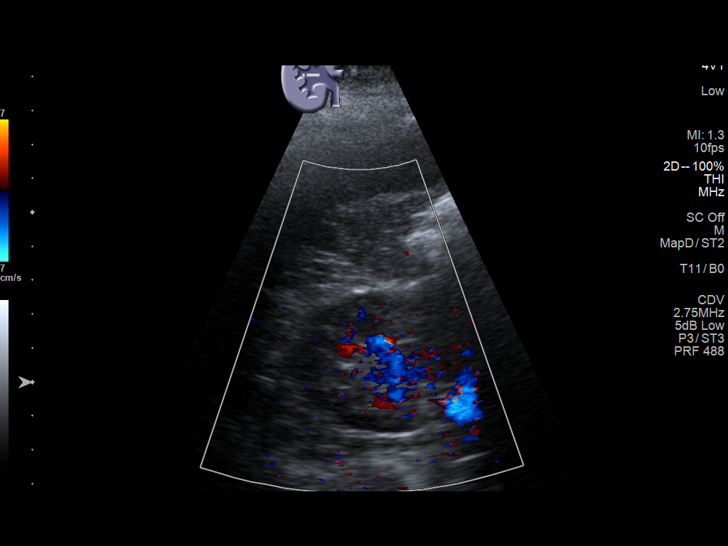
[im 18/40]
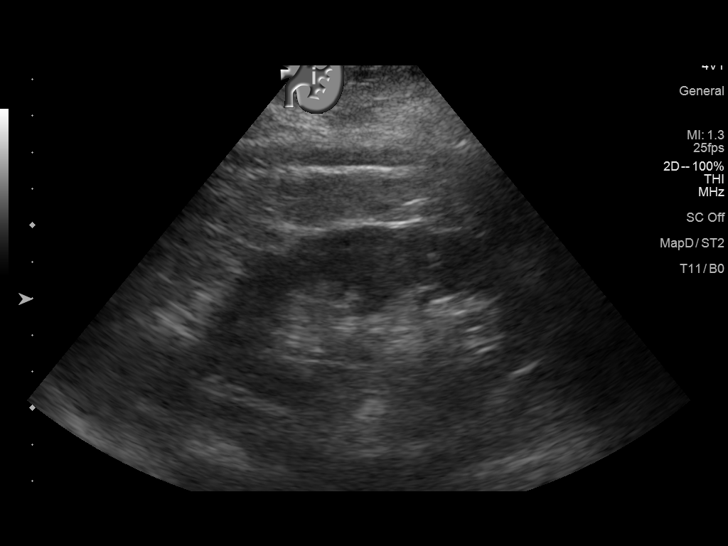
[im 22/40]
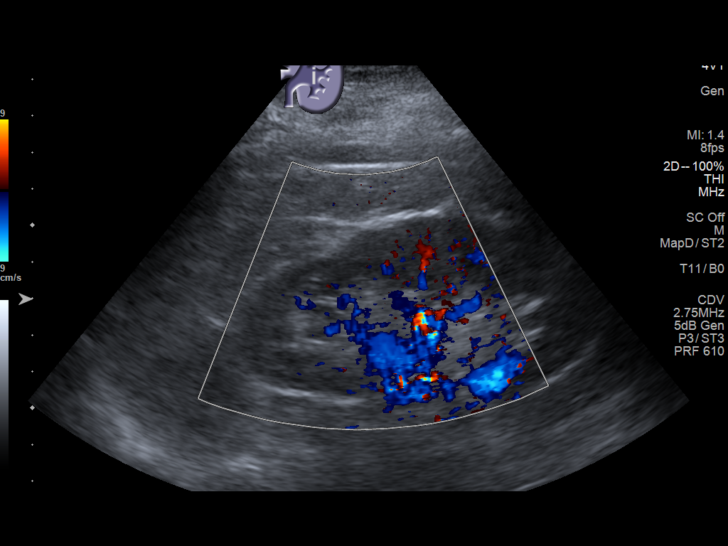
[im 25/40]
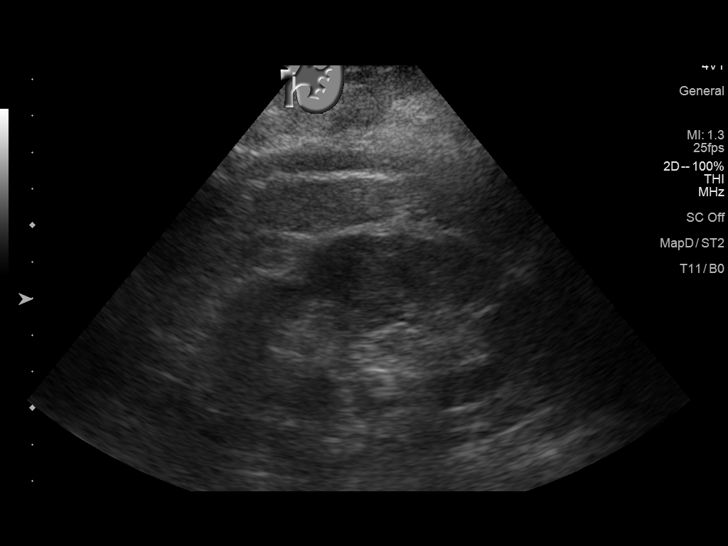
[im 27/40]
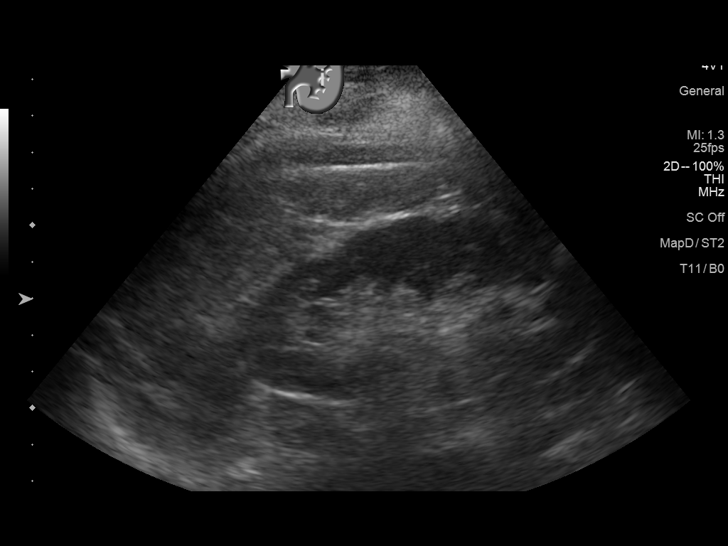
[im 30/40]
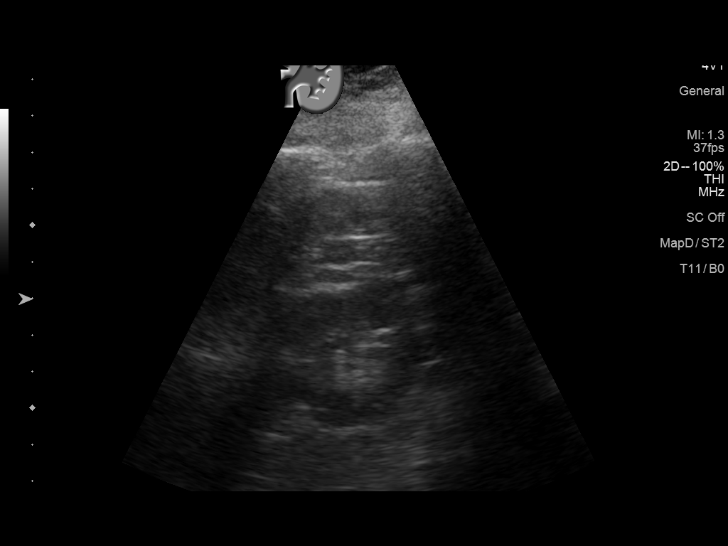
[im 33/40]
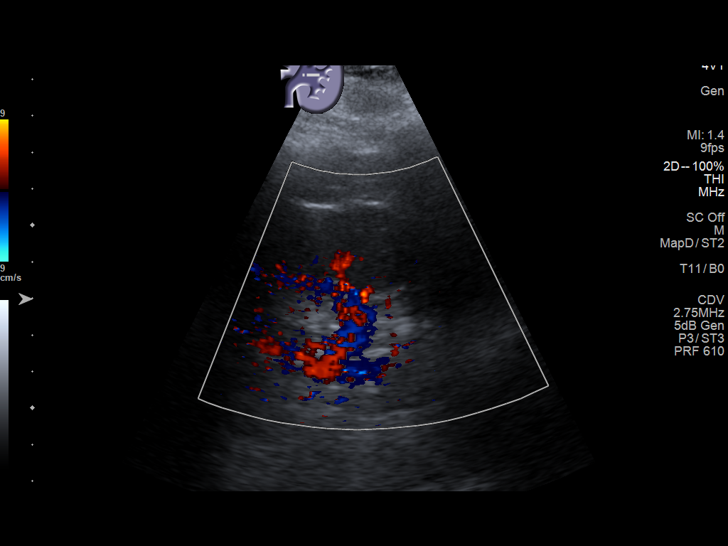
[im 36/40]
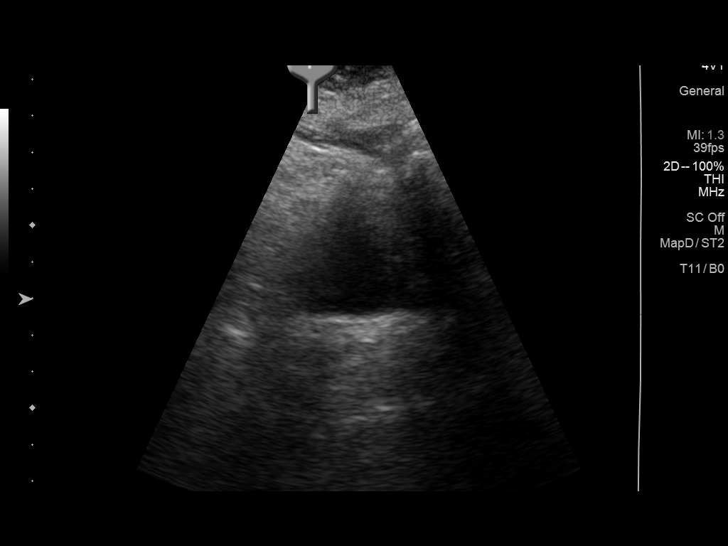
[im 40/40]
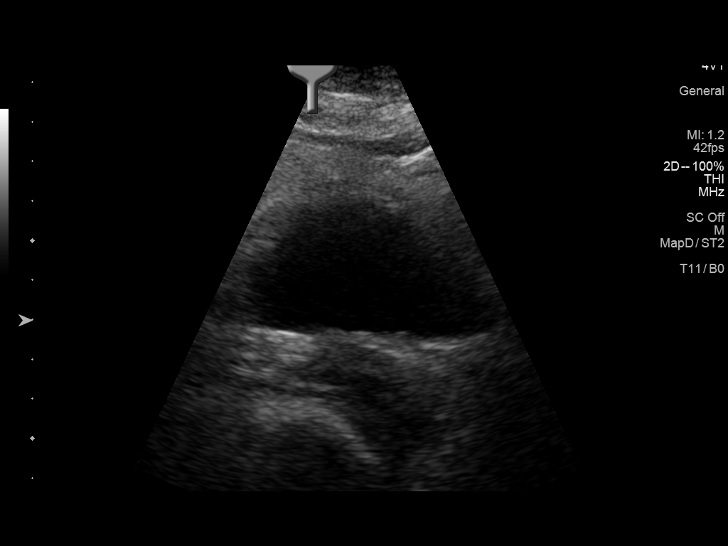

[14 of 25 positions shown; findings below may reference images not displayed]

FINDINGS: Right Kidney:

Renal measurements: 10.2 x 4.3 x 5.1 cm = volume: 116 mL .
Echogenicity within normal limits. No mass or hydronephrosis
visualized.

Left Kidney:

Renal measurements: 9.7 x 4.7 x 4.2 cm = volume: 98 mL. Echogenicity
within normal limits. No mass or hydronephrosis visualized.

Bladder:

Appears normal for degree of bladder distention. Normal bilateral
ureteral jets.

Other:

None.
IMPRESSION: Normal renal ultrasound.  No evidence of atrophy or obstruction.

## 2021-03-27 ENCOUNTER — Ambulatory Visit
Admission: RE | Admit: 2021-03-27 | Discharge: 2021-03-27 | Disposition: A | Discharge status: Home / Self Care | Payer: Medicare HMO | Source: Ambulatory Visit | Attending: Radiation Oncology | Admitting: Radiation Oncology

## 2021-03-27 ENCOUNTER — Other Ambulatory Visit: Payer: Self-pay

## 2021-03-27 ENCOUNTER — Encounter: Payer: Self-pay | Admitting: Radiation Oncology

## 2021-03-27 ENCOUNTER — Inpatient Hospital Stay: Payer: Medicare HMO | Attending: Oncology

## 2021-03-27 VITALS — BP 132/63 | HR 61 | Temp 95.7°F | Ht 64.0 in | Wt 241.9 lb

## 2021-03-27 DIAGNOSIS — I509 Heart failure, unspecified: Secondary | ICD-10-CM | POA: Diagnosis not present

## 2021-03-27 DIAGNOSIS — Z923 Personal history of irradiation: Secondary | ICD-10-CM | POA: Insufficient documentation

## 2021-03-27 DIAGNOSIS — J449 Chronic obstructive pulmonary disease, unspecified: Secondary | ICD-10-CM | POA: Diagnosis not present

## 2021-03-27 DIAGNOSIS — C541 Malignant neoplasm of endometrium: Secondary | ICD-10-CM | POA: Insufficient documentation

## 2021-03-27 DIAGNOSIS — N189 Chronic kidney disease, unspecified: Secondary | ICD-10-CM | POA: Insufficient documentation

## 2021-03-27 NOTE — Progress Notes (Signed)
Radiation Oncology Follow up Note  Name: Bridget Murphy   Date:   03/27/2021 MRN:  [id number] DOB: July 29, 1945    This 76 y.o. female presents to the clinic today for 7-month follow-up status post external beam radiation therapy as well as brachytherapy for stage II high-grade endometrial carcinoma.  REFERRING PROVIDER: Lynnell Jude, MD  HPI: Patient is a 76 year old female now out 11 months having completed adjuvant radiation therapy with both external beam as well as brachytherapy for FIGO grade 2 or 3 endometrial adenocarcinoma invading 17 of 21 mm of myometrium status post laparoscopic hysterectomy and BSO.  She is seen today in routine follow-up and is doing well we did not do chemotherapy based on her comorbidities.  She specifically denies any increased lower urinary tract symptoms diarrhea vaginal discharge..  She had CT scans of chest abdomen and pelvis back in September which I have reviewed showing no evidence of metastatic disease.  Known endometrial thickening is not well appreciated.  COMPLICATIONS OF TREATMENT: none  FOLLOW UP COMPLIANCE: keeps appointments   PHYSICAL EXAM:  BP 132/63    Pulse 61    Temp (!) 95.7 F (35.4 C)    Ht 5\' 4"  (1.626 m)    Wt 241 lb 14.4 oz (109.7 kg)    BMI 41.52 kg/m  Obese female in NAD wheelchair-bound well-developed well-nourished patient in NAD. HEENT reveals PERLA, EOMI, discs not visualized.  Oral cavity is clear. No oral mucosal lesions are identified. Neck is clear without evidence of cervical or supraclavicular adenopathy. Lungs are clear to A&P. Cardiac examination is essentially unremarkable with regular rate and rhythm without murmur rub or thrill. Abdomen is benign with no organomegaly or masses noted. Motor sensory and DTR levels are equal and symmetric in the upper and lower extremities. Cranial nerves II through XII are grossly intact. Proprioception is intact. No peripheral adenopathy or edema is identified. No motor or sensory levels are  noted. Crude visual fields are within normal range.  RADIOLOGY RESULTS: CT scans chest abdomen pelvis reviewed compatible with above-stated findings  PLAN: Present time patient continues to do well.  She does have comorbidities including CHF chronic renal disease COPD and has had prior MI with 3 stents placed.  I have asked to see her back in 1 year for follow-up.  Patient knows to call with any concerns at any time.  She continues follow-up with GYN oncology clinic.  I would like to take this opportunity to thank you for allowing me to participate in the care of your patient.Noreene Filbert, MD

## 2021-06-25 IMAGING — CT CT ABD-PELV W/O CM
2 of 4 series · 15 of 46 positions shown, 17 images · non-contrast
Comparison: None.

CLINICAL DATA: Uterine cancer.  Initial staging examination

EXAM:
CT CHEST, ABDOMEN AND PELVIS WITHOUT CONTRAST
TECHNIQUE: Multidetector CT imaging of the chest, abdomen and pelvis was
performed following the standard protocol without IV contrast.

[Series 2: axials cap 5.00 · axial · 0.93mm/px · z∈[-1365,-835]mm · 12 of 124 slices shown, 14 images]
[im 9/124  soft-tissue]
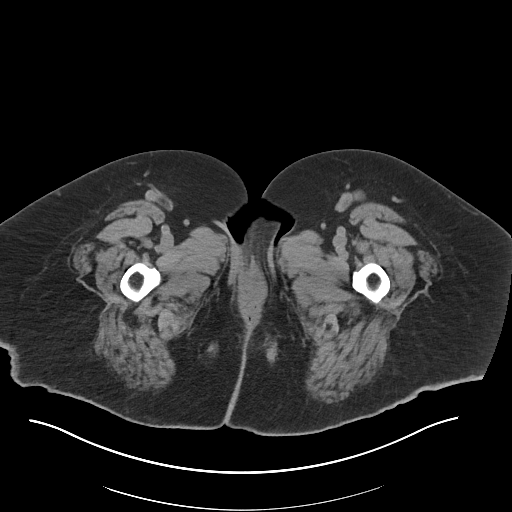
[im 9/124  bone]
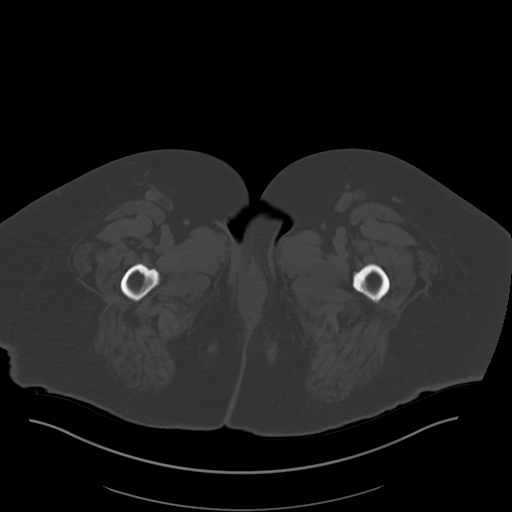
[im 18/124  soft-tissue]
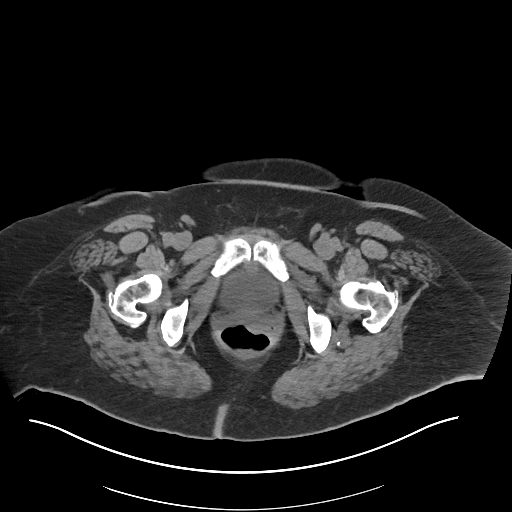
[im 27/124  soft-tissue]
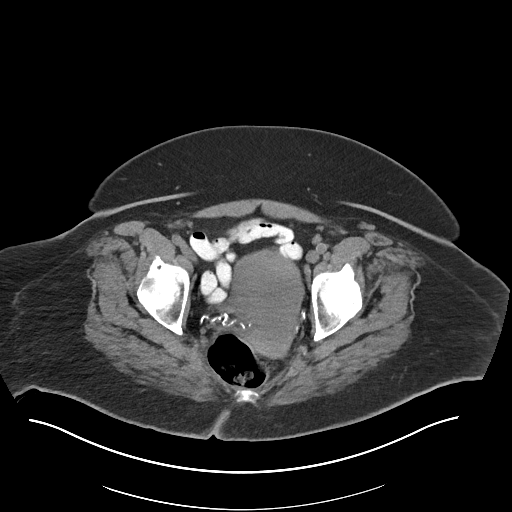
[im 36/124  soft-tissue]
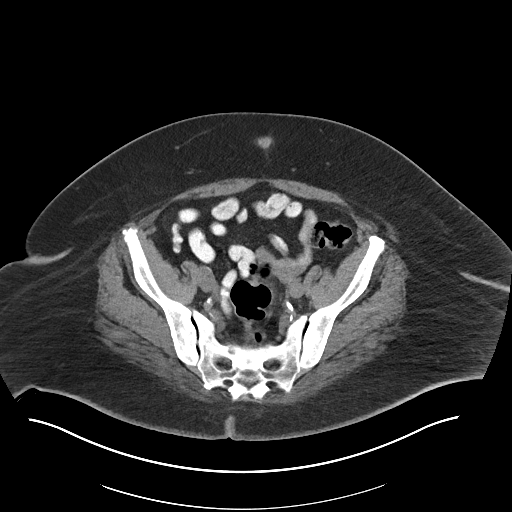
[im 44/124  soft-tissue]
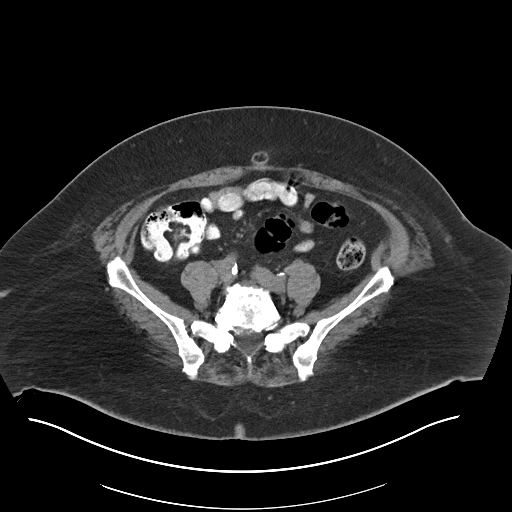
[im 53/124  soft-tissue]
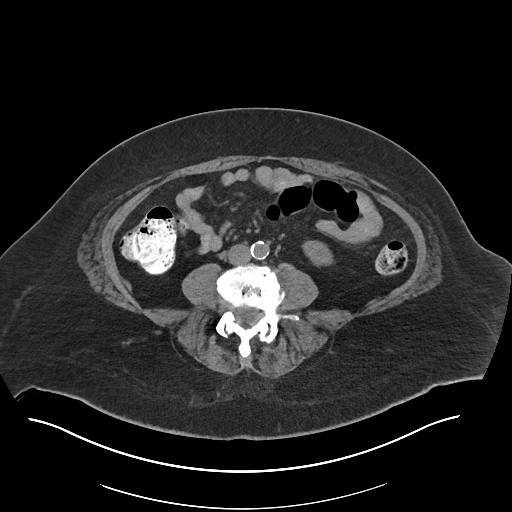
[im 71/124  soft-tissue]
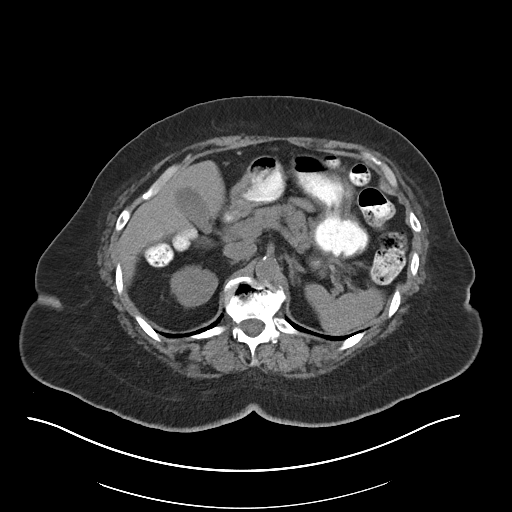
[im 80/124  soft-tissue]
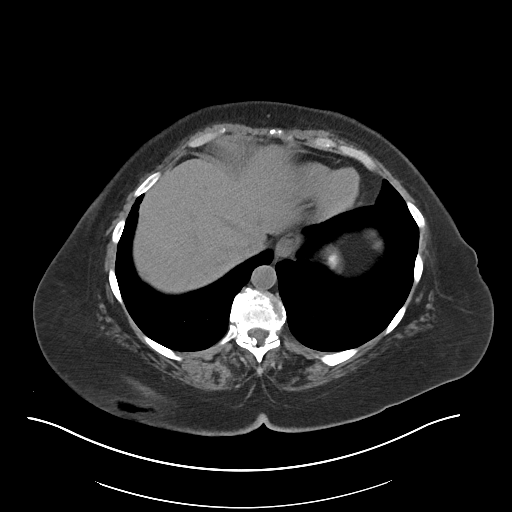
[im 88/124  soft-tissue]
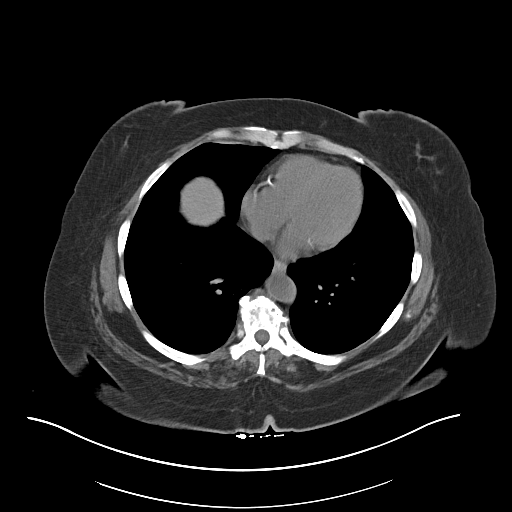
[im 88/124  bone]
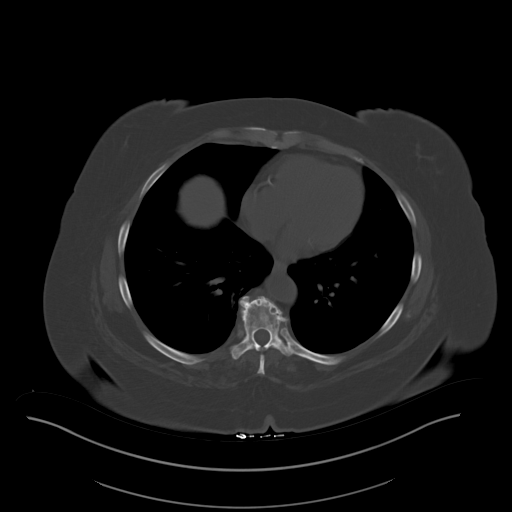
[im 97/124  soft-tissue]
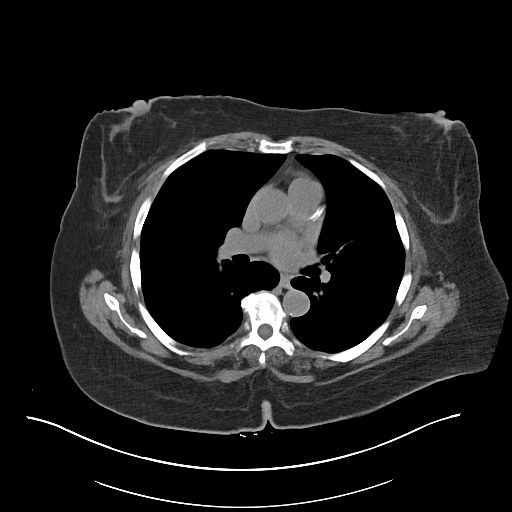
[im 106/124  soft-tissue]
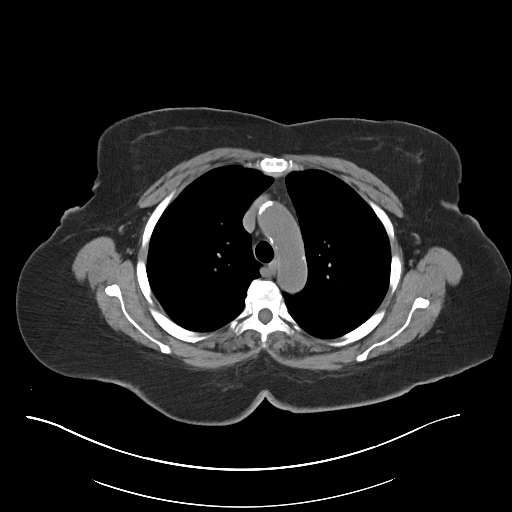
[im 115/124  soft-tissue]
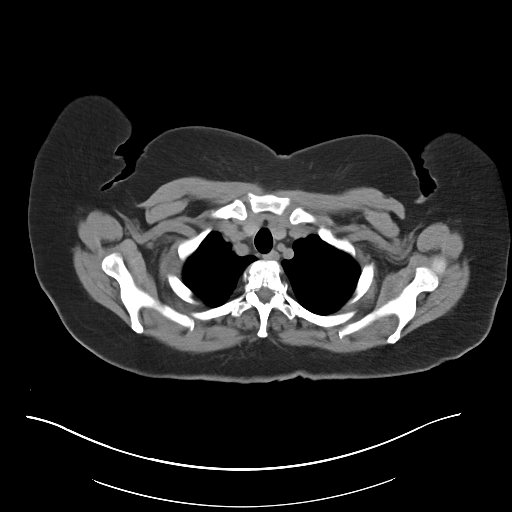

[Series 4: coronals cap 2.00 cor · coronal · 0.93mm/px · 3 of 190 slices shown]
[im 64/190  soft-tissue]
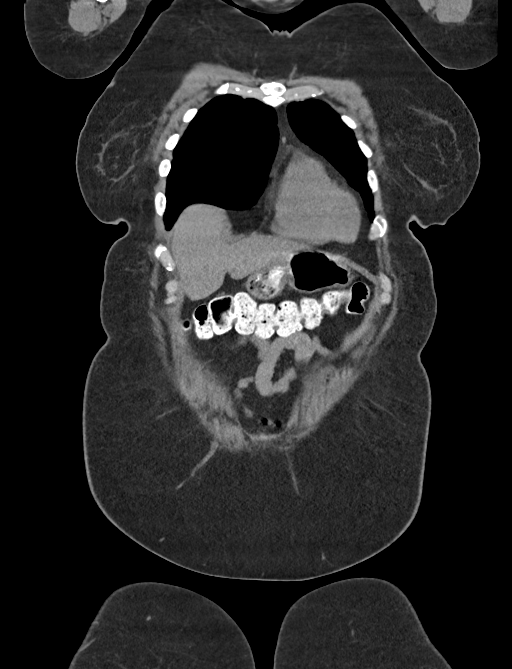
[im 85/190  soft-tissue]
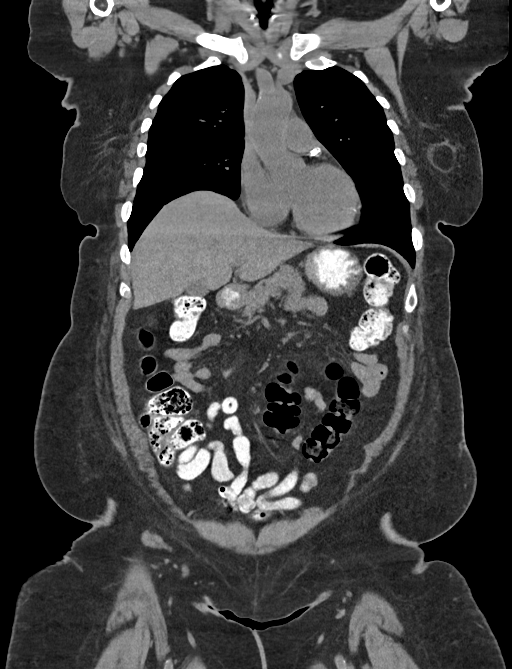
[im 106/190  soft-tissue]
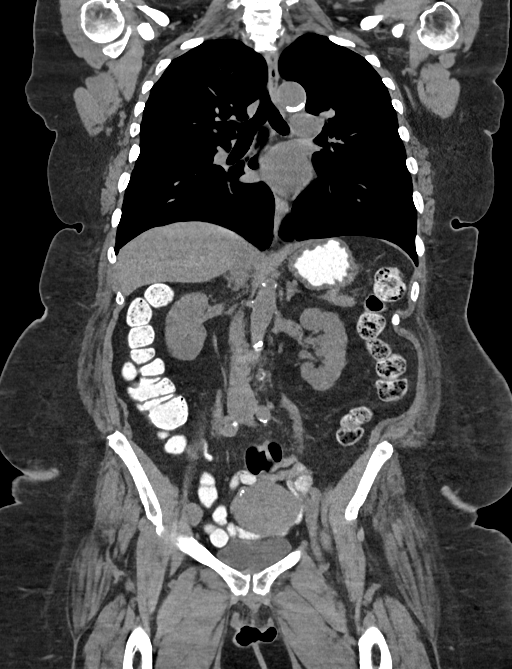

[15 of 46 positions shown; findings below may reference images not displayed]

FINDINGS: CT CHEST FINDINGS

Cardiovascular: Extensive coronary artery calcification. Left
anterior descending coronary artery stenting has been performed.
Global cardiac size within normal limits. The central pulmonary
arteries are of normal caliber. Mild atherosclerotic calcification
within the thoracic aorta. No aortic aneurysm identified.

Mediastinum/Nodes: No enlarged mediastinal or axillary lymph nodes.
Thyroid gland, trachea, and esophagus demonstrate no significant
findings.

Lungs/Pleura: Lungs are clear. No pleural effusion or pneumothorax.

Musculoskeletal: No lytic or blastic bone lesions are identified.

CT ABDOMEN PELVIS FINDINGS

Hepatobiliary: No focal liver abnormality is seen. No gallstones,
gallbladder wall thickening, or biliary dilatation.

Pancreas: Unremarkable

Spleen: Unremarkable

Adrenals/Urinary Tract: Adrenal glands are unremarkable. Kidneys are
normal, without renal calculi, focal lesion, or hydronephrosis.
Bladder is unremarkable.

Stomach/Bowel: The stomach, small bowel, large bowel, and appendix
are unremarkable. No free intraperitoneal gas or fluid.

Vascular/Lymphatic: No pathologic adenopathy within the abdomen and
pelvis. Moderate aortoiliac atherosclerotic calcification without
evidence of aneurysm.

Reproductive: Known endometrial thickening is not well appreciated
on this noncontrast examination. The pelvic organs are otherwise
unremarkable. No adnexal masses are seen.

Other: Rectum unremarkable.

Musculoskeletal: Advanced degenerative changes are seen within the
lumbar spine. No lytic or blastic bone lesions are seen.
IMPRESSION: 1. No evidence of metastatic disease within the chest, abdomen, or
pelvis.
2. Known endometrial thickening is not well appreciated on this
noncontrast examination.
3. Extensive coronary artery calcification. Left anterior descending
coronary artery stenting has been performed.

Aortic Atherosclerosis ([id number]).

## 2021-06-25 IMAGING — CT CT CHEST W/O CM
2 of 4 series · 14 of 36 positions shown, 17 images · non-contrast
Comparison: None.

CLINICAL DATA: Uterine cancer.  Initial staging examination

EXAM:
CT CHEST, ABDOMEN AND PELVIS WITHOUT CONTRAST
TECHNIQUE: Multidetector CT imaging of the chest, abdomen and pelvis was
performed following the standard protocol without IV contrast.

[Series 2: axials cap 5.00 · axial · 0.93mm/px · z∈[-1355,-845]mm · 11 of 124 slices shown, 14 images]
[im 11/124  mediastinal]
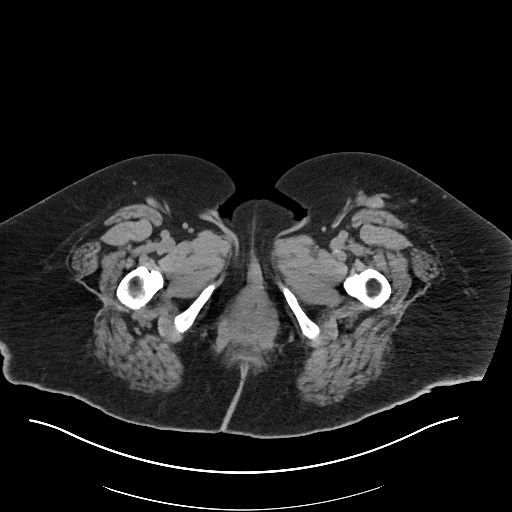
[im 11/124  lung]
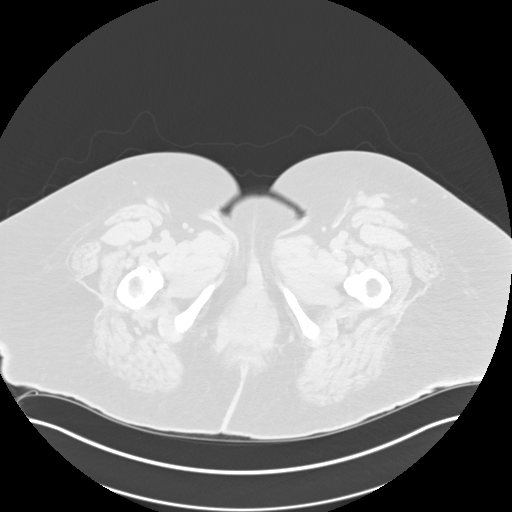
[im 21/124  lung]
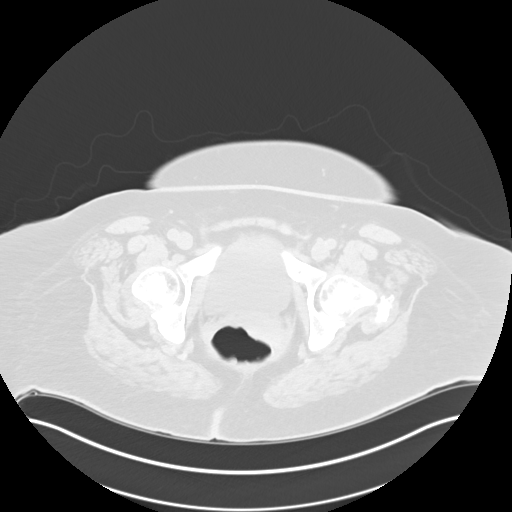
[im 31/124  lung]
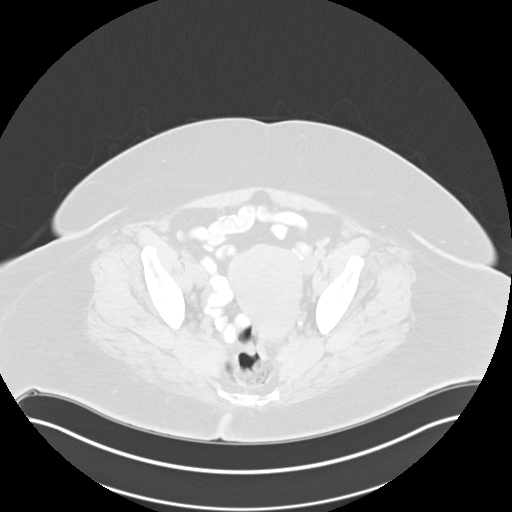
[im 42/124  lung]
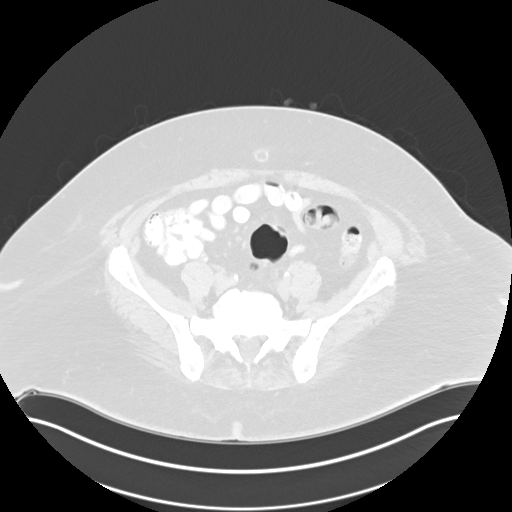
[im 52/124  mediastinal]
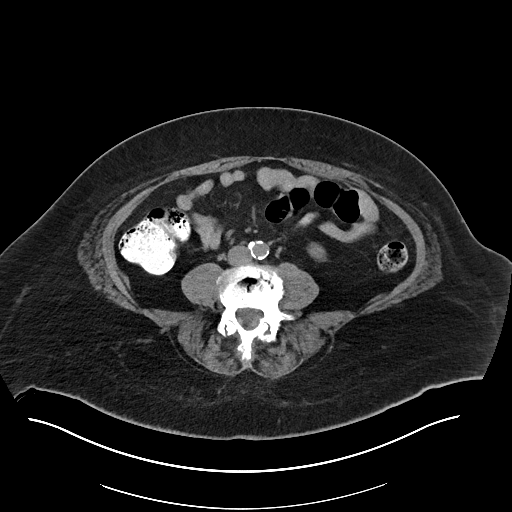
[im 52/124  lung]
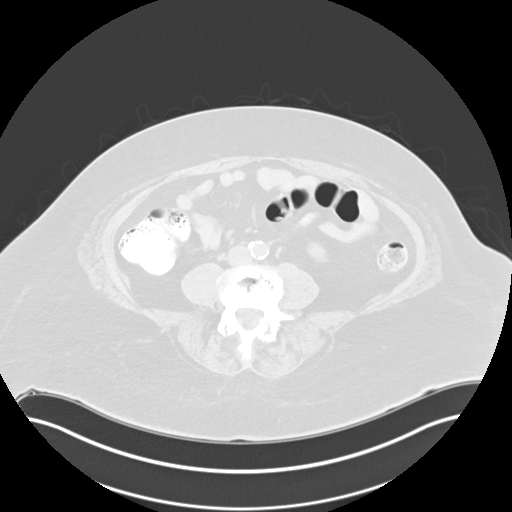
[im 62/124  lung]
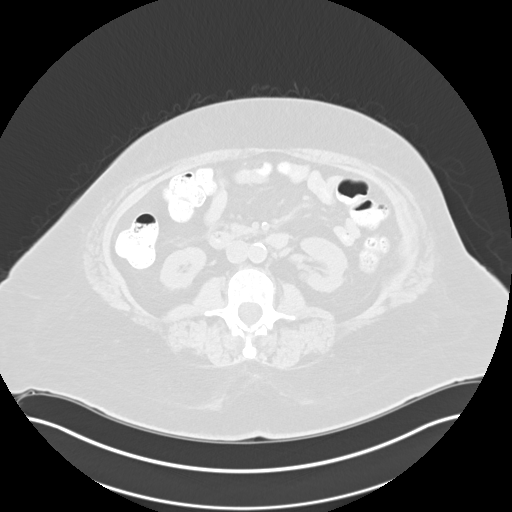
[im 72/124  lung]
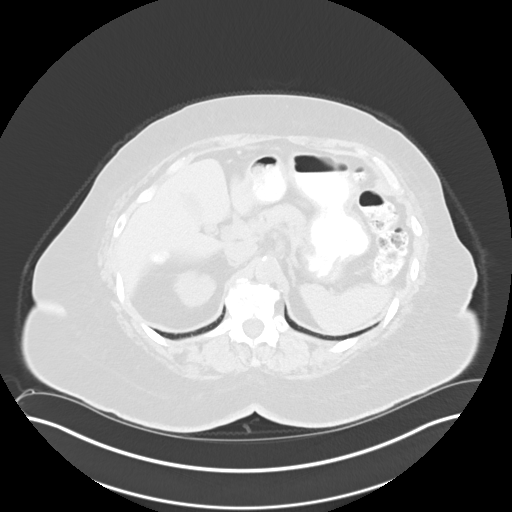
[im 83/124  lung]
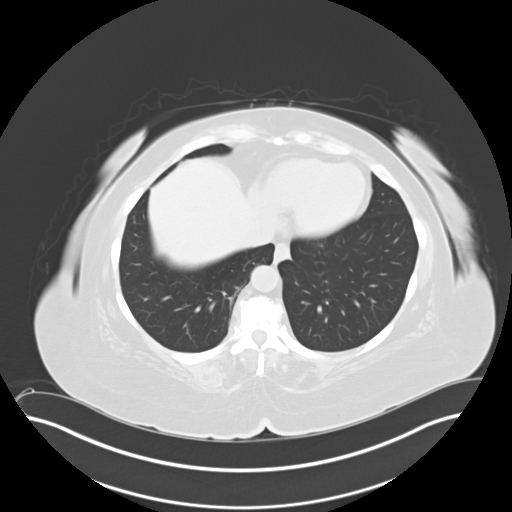
[im 93/124  mediastinal]
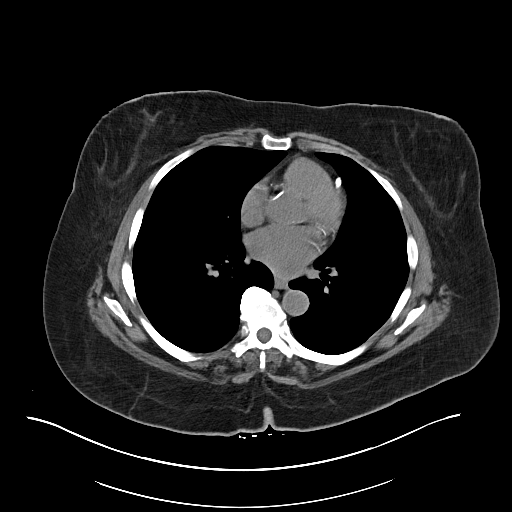
[im 93/124  lung]
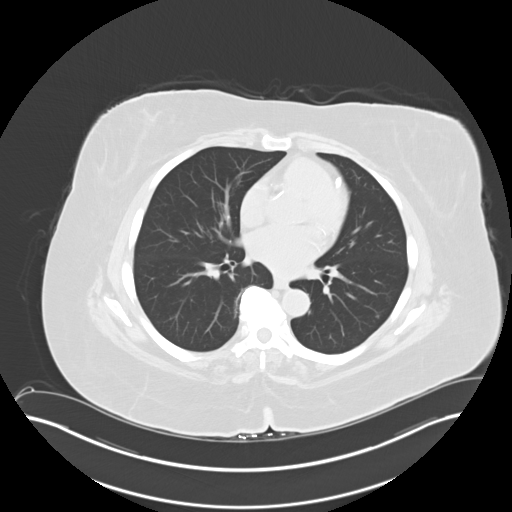
[im 103/124  lung]
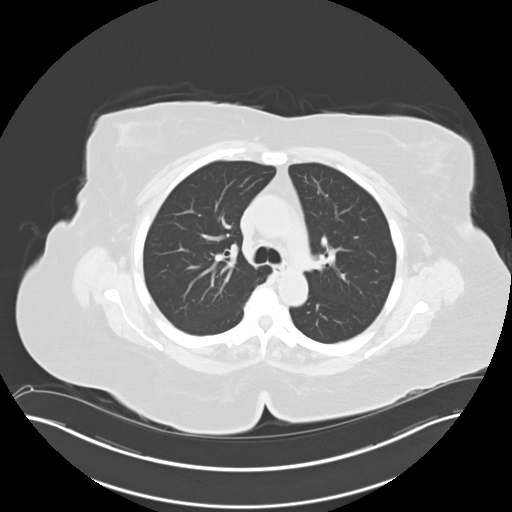
[im 113/124  lung]
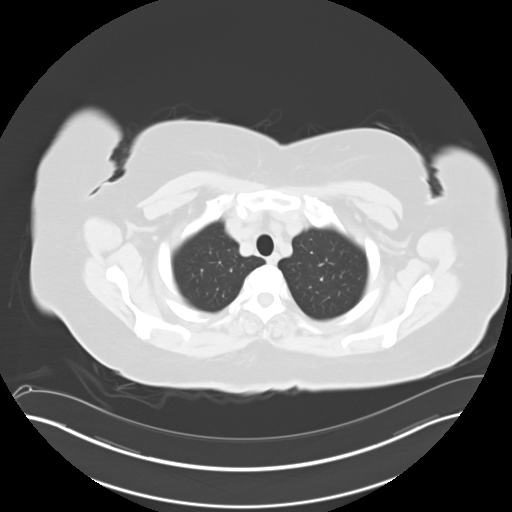

[Series 4: coronals cap 2.00 cor · coronal · 0.93mm/px · 3 of 190 slices shown]
[im 38/190  lung]
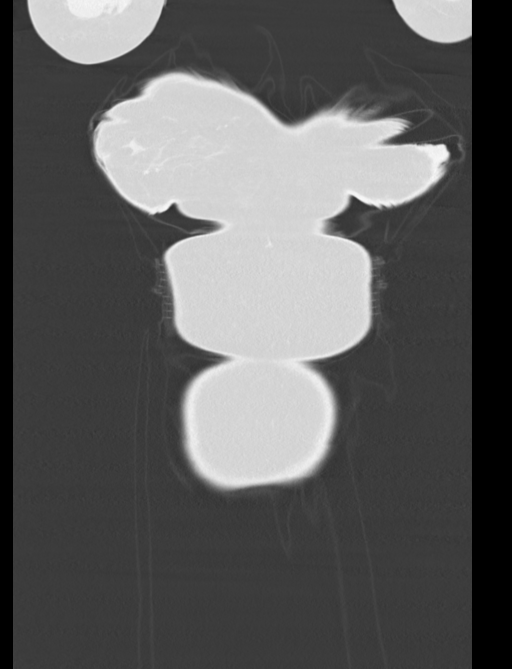
[im 76/190  lung]
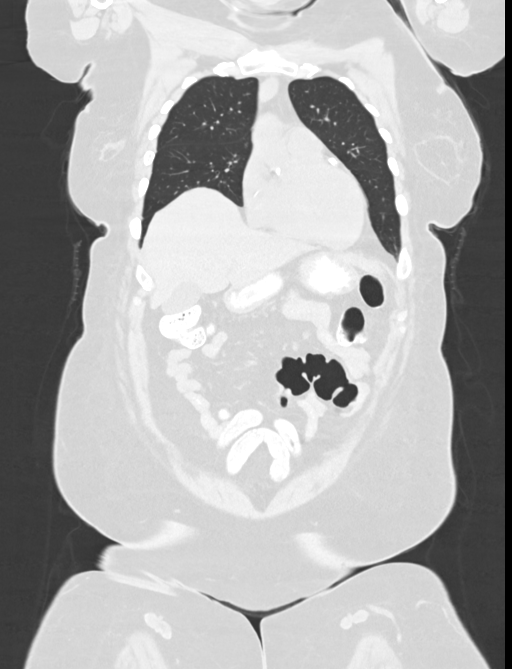
[im 114/190  lung]
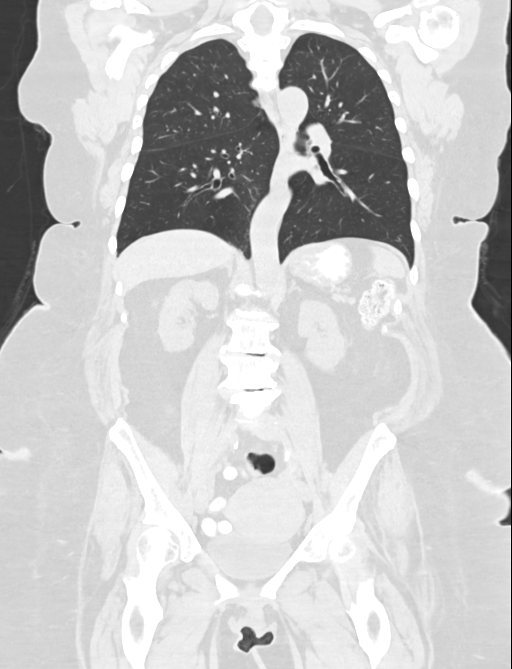

[14 of 36 positions shown; findings below may reference images not displayed]

FINDINGS: CT CHEST FINDINGS

Cardiovascular: Extensive coronary artery calcification. Left
anterior descending coronary artery stenting has been performed.
Global cardiac size within normal limits. The central pulmonary
arteries are of normal caliber. Mild atherosclerotic calcification
within the thoracic aorta. No aortic aneurysm identified.

Mediastinum/Nodes: No enlarged mediastinal or axillary lymph nodes.
Thyroid gland, trachea, and esophagus demonstrate no significant
findings.

Lungs/Pleura: Lungs are clear. No pleural effusion or pneumothorax.

Musculoskeletal: No lytic or blastic bone lesions are identified.

CT ABDOMEN PELVIS FINDINGS

Hepatobiliary: No focal liver abnormality is seen. No gallstones,
gallbladder wall thickening, or biliary dilatation.

Pancreas: Unremarkable

Spleen: Unremarkable

Adrenals/Urinary Tract: Adrenal glands are unremarkable. Kidneys are
normal, without renal calculi, focal lesion, or hydronephrosis.
Bladder is unremarkable.

Stomach/Bowel: The stomach, small bowel, large bowel, and appendix
are unremarkable. No free intraperitoneal gas or fluid.

Vascular/Lymphatic: No pathologic adenopathy within the abdomen and
pelvis. Moderate aortoiliac atherosclerotic calcification without
evidence of aneurysm.

Reproductive: Known endometrial thickening is not well appreciated
on this noncontrast examination. The pelvic organs are otherwise
unremarkable. No adnexal masses are seen.

Other: Rectum unremarkable.

Musculoskeletal: Advanced degenerative changes are seen within the
lumbar spine. No lytic or blastic bone lesions are seen.
IMPRESSION: 1. No evidence of metastatic disease within the chest, abdomen, or
pelvis.
2. Known endometrial thickening is not well appreciated on this
noncontrast examination.
3. Extensive coronary artery calcification. Left anterior descending
coronary artery stenting has been performed.

Aortic Atherosclerosis ([id number]).

## 2021-08-06 ENCOUNTER — Inpatient Hospital Stay: Payer: Medicare HMO

## 2021-08-06 ENCOUNTER — Inpatient Hospital Stay: Payer: Medicare HMO | Attending: Obstetrics and Gynecology | Admitting: Obstetrics and Gynecology

## 2021-08-06 ENCOUNTER — Encounter: Payer: Self-pay | Admitting: Obstetrics and Gynecology

## 2021-08-06 VITALS — BP 135/58 | HR 58 | Resp 18 | Wt 251.0 lb

## 2021-08-06 DIAGNOSIS — Z90710 Acquired absence of both cervix and uterus: Secondary | ICD-10-CM | POA: Insufficient documentation

## 2021-08-06 DIAGNOSIS — Z08 Encounter for follow-up examination after completed treatment for malignant neoplasm: Secondary | ICD-10-CM

## 2021-08-06 DIAGNOSIS — Z90722 Acquired absence of ovaries, bilateral: Secondary | ICD-10-CM | POA: Diagnosis not present

## 2021-08-06 DIAGNOSIS — C541 Malignant neoplasm of endometrium: Secondary | ICD-10-CM

## 2021-08-06 DIAGNOSIS — Z923 Personal history of irradiation: Secondary | ICD-10-CM | POA: Diagnosis not present

## 2021-08-06 DIAGNOSIS — Z8542 Personal history of malignant neoplasm of other parts of uterus: Secondary | ICD-10-CM | POA: Insufficient documentation

## 2021-08-06 NOTE — Progress Notes (Signed)
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 female who is seen in consultation from Dr. Georgianne Fick for high-grade endometrial adenocarcinoma s/p external pelvic RT and vaginal brachytherapy with Dr. Baruch Gouty 1/22 who returns to clinic for continued surveillance.   No new complaints today. Here for follow up.  Gynecologic Oncology History Lilyonna Steidle is a pleasant female who is seen in consultation from Dr. Georgianne Fick for high-grade endometrial adenocarcinoma and postmenopausal bleeding. Please see prior notes for complete details.   Pelvic US 10/23/2019 The uterus is anteverted and measures 11.3 x 7.0 x 6.0 cm. Echo texture is heterogenous with evidence of focal masses. Within the uterus There are possibly submucosal fibroids vs. Polyps measuring 25.2 x 21.6 x 31.3 mm, 16.7 x 8.3 x 13.8 mm, 12.9 x 8.9 x 12.3 mm. There is also a thick endometrium. The lining in the cervical canal is thick also.    The Endometrium measures 29.0 mm. No IUD is seen.    Right Ovary measures 3.2 x 2.1 x 1.7 cm. It is normal in appearance. There is a follicle in the right ovary measuring 12.0 x 7.9 x 11.0 mm Left Ovary measures 2.5 x 2.2 x 1.0 cm. It is normal in appearance. Survey of the adnexa demonstrates no adnexal masses. There is no free fluid in the cul de sac.   Impression: 1. There are possible fibroids and or polyps within the endometrium canal.  2. The endometrial lining appears thick, as well as, the cervical canal lining.  3. The ovaries appear normal.  4. No IUD is seen.    10/23/2019 Endometrial biopsy performed   A. ENDOMETRIUM, BIOPSY:  - High-grade endometrial adenocarcinoma, FIGO grade 3.  - The carcinoma has mostly endometrioid features.    Preop CT CA/P 11/06/2019:   IMPRESSION: 1. No evidence of metastatic disease within the chest, abdomen, or pelvis. 2. Known endometrial thickening is not well appreciated on this noncontrast examination. 3. Extensive coronary artery calcification. Left anterior descending coronary artery stenting has been performed.  On 11/30/2019 she underwent Exam under anesthesia, Diagnostic laparoscopy, Robot-assisted total laparoscopic hysterectomy, Bilateral salpingo-oophorectomy, Sentinel lymph node mapping, Bilateral pelvic sentinel lymph node biopsy, and Omental biopsy.  DIAGNOSIS  A.  Uterus, bilateral ovaries and fallopian tubes, hysterectomy and bilateral salpingo-oophorectomy:   Endometrioid adenocarcinoma of the endometrium (9 cm), FIGO grade 2 of 3, invading 17 mm in a 21 mm thick myometrium, with stromal invasion of the cervix. Negative for lymphatic vascular space invasion.  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  Right ovary and fallopian tube: Negative for malignancy. Left ovary and fallopian tube: Negative for malignancy.  Microsatellite instability and mismatch repair protein immunohistochemistry: Pending, paraffin block A2, to be reported separately from the molecular diagnostics laboratory.  See synoptic report below.  B.  Right obturator, primary sentinel node, biopsy: One lymph node, negative for malignancy (0/1).  C.  Left external iliac artery sentinel lymph node, biopsy: One lymph node, negative for malignancy (0/1).  D.  Left obturator, primary sentinel lymph node, biopsy: One lymph node, negative for malignancy (0/1).  E.  Left large obturator lymph node, biopsy: One lymph node, negative for metastatic carcinoma (0/1).. See comment.  Comment: Because of the large size of this lymph node (4.2 cm), it will be referred to our hematopathology  division for additional review.   An addendum will follow.    F.  Left internal iliac, biopsy: One  lymph node, negative for malignancy (0/1).  G.  Omentum, omentectomy: Omentum, negative for malignancy.   Electronically signed by Rush Barer, MD on 12/11/2019 at 1559  Addendum 2  This part of hematopathology consultation (specimen E) has been reviewed in our weekly hematopathology QA conference, attended by Drs. Alvera Novel, ASL, JN, SW and EC besides me, and the interpretation and diagnosis rendered above reflect the consensus of the discussion among the attendee.  Addendum electronically signed by Lynnae Sandhoff, MD on 12/18/2019 at [facility]   Regarding the enlarged lymph node (left obturator, specimen E), microscopic examination reveals profiles of lymph node with somewhat preserved normal nodal architecture. The cellular constituents appear somewhat disorganized, with slightly expanded sinuses and abundant pink fibrillar hyaline material (with focal calcification) scattered throughout the node.   A panel of immunohistochemical stains was performed after review of H&E slides, on block E2, to assess the lymph node, with appropriate positive and negative controls:   IHC Result  CD3/CD20 Highlights small mature B-cells (follicles) and T-cells in appropriate distributions.  BCL2 Negative in germinal centers. Positive in scattered paracortical cells.  BCL6 Highlights germinal centers.  Ki67 Appropriately elevated in germinal centers, which appear well-defined with retained polarity. Otherwise very low proliferation index.  Kappa/Lambda Light chain expression appears polytypic.  S100 Essentially negative.  CD68 Highlights increased sinus histiocytes.    Taken together, the morphologic and immunophenotypic findings are consistent with reactive follicular hyperplasia with sinus histiocytosis. There is no histopathologic evidence of lymphoproliferative disorder. The etiology of the abundant sclerotic change with focal calcification is unclear.   [place]: Diagnostic  Interpretation A ATYPICAL/INCONCLUSIVE.  Rare atypical cells, too few to classify, see COMMENT. COMMENT: The cell block is paucicellular and does not contain the cells of interest, precluding further evaluation.  DMMR/MSI status  Mismatch repair: INTACT Immunohistochemistry: Normal result. Expression of MLH1, MSH2, MSH6, and PMS2 is retained PCR: Microsatellite stable (MSS)  P53 immunohistochemistry (block A1):   Negative (wild type pattern).  Tumor Board recommendations:  Recommendations: WPRT + VBT vs 3 cycles carboplatin and paclitaxel +VBT (GOG249 regimen), vs EBRT/cisplatin + 4 cycles carboplatin and paclitaxel (PORTEC3 regimen) vs consider VBT alone (not first choice due to grade, tumor size, deep invasion, and cervical stromal invasion)    She also has significant medical history with h/o MI, CHF, COPD, stage 3 chronic kidney disease, and elevated BMI.   Tumor Board recommendation was for 3 cycles of chemotherapy (p53 wild-type). Dr. Tasia Catchings felt that given her multiple medical problems, including HTN, HLD, CAD, history of MI in 2012, LVEF 50%, CKD, COPD, lupus nephritis, morbid obesity, PS 2-3, the recommendation was for adjuvant radiation, without adjuvant chemotherapy. Her PS seemed to be improving and Dr Theora Gianotti discussed adding chemotherapy and reviewed the goal of chemotherapy to reduce her recurrence risk, but that chemotherapy is associated with risks that may negatively affect her quality of life. She was not interested in chemotherapy. Her PS was 2. While she is able to make her own meals she is in a chair >50% of the day.   She finished radiation treatments with external pelvic RT and vaginal brachytherapy with Dr. Baruch Gouty 1/22 .    Germline testing: not indicated  Problem List: Patient Active Problem List   Diagnosis Date Noted   Goals of care, counseling/discussion 01/27/2020   CAD (coronary artery disease) 11/22/2019  Endometrial cancer ([place]) 11/21/2019   COVID-19  vaccine series completed 11/14/2019   Endometrial adenocarcinoma ([place]) 11/01/2019   Bilateral chronic knee pain 04/04/2019   Ds DNA antibody positive 04/04/2019   Lymphedema of both lower extremities 04/04/2019   Chronic gouty arthropathy without tophi 04/04/2019   Hyperparathyroidism due to renal insufficiency ([place]) 02/19/2019   Edema of lower extremity 02/19/2019   Gout 02/19/2019   Acute gout involving toe 09/28/2016   Systemic lupus erythematosus ([place]) 09/28/2016   Chronic diastolic CHF (congestive heart failure) ([place]) 09/11/2015   Stage 3 chronic kidney disease ([place]) 03/11/2015   Acute myocardial infarction, unspecified ([place]) 10/04/2013   Essential hypertension 10/04/2013   Hyperlipidemia 10/04/2013   Morbid obesity ([place]) 10/04/2013   Other specified postprocedural states 10/04/2013    Past Medical History: Past Medical History:  Diagnosis Date   Acute myocardial infarction [facility])    s/p three stents; anterior STEMI, DES LAD 09/25/2010   Arthritis    Chronic kidney disease    COPD (chronic obstructive pulmonary disease) ([place])    Hyperlipidemia    Hypertension    Past Surgical History: Past Surgical History:  Procedure Laterality Date   HYSTERECTOMY ABDOMINAL WITH SALPINGO-OOPHORECTOMY  11/30/2019    Exam under anesthesia, Diagnostic laparoscopy, Robot-assisted total laparoscopic hysterectomy, Bilateral salpingo-oophorectomy, Sentinel lymph node mapping, Bilateral pelvic sentinel lymph node biopsy, and Omental biopsy.   Past Gynecologic History:  As per HPI  OB History:  OB History  Gravida Para Term Preterm AB Living  1 1          SAB IAB Ectopic Multiple Live Births               # Outcome Date GA Lbr Len/2nd Weight Sex Delivery Anes PTL Lv  1 Para             Obstetric Comments  SVD x 1   Family History: Family History  Problem Relation Age of Onset   Cervical cancer Mother    Cervical cancer Sister    Prostate cancer Brother    Prostate cancer Brother     Prostate cancer Brother    Immunization History  Administered Date(s) Administered   Influenza, High Dose Seasonal PF 11/14/2019   Moderna Sars-Covid-2 Vaccination 05/31/2019, 06/24/2019   PFIZER Comirnaty(Gray Top)Covid-19 Tri-Sucrose Vaccine 05/31/2019, 06/24/2019   Social History: Social History   Socioeconomic History   Marital status: Widowed    Spouse name: Not on file   Number of children: Not on file   Years of education: Not on file   Highest education level: Not on file  Occupational History   Not on file  Tobacco Use   Smoking status: Former    Packs/day: 0.50    Types: Cigarettes    Quit date: 12/25/2010    Years since quitting: 10.6   Smokeless tobacco: Never  Vaping Use   Vaping Use: Never used  Substance and Sexual Activity   Alcohol use: Not Currently   Drug use: Never   Sexual activity: Not on file  Other Topics Concern   Not on file  Social History Narrative   Not on file   Social Determinants of Health   Financial Resource Strain: Not on file  Food Insecurity: Not on file  Transportation Needs: Not on file  Physical Activity: Not on file  Stress: Not on file  Social Connections: Not on file  Intimate Partner Violence: Not on file   Allergies: Allergies  Allergen Reactions   Acetaminophen Other (See Comments)    "  Heart fluttering"   Furosemide Rash   Current Medications: Current Outpatient Medications  Medication Sig Dispense Refill   allopurinol (ZYLOPRIM) 100 MG tablet Take 50 mg by mouth daily.      aspirin 81 MG EC tablet Take 81 mg by mouth.      atorvastatin (LIPITOR) 40 MG tablet Take by mouth.     calcium elemental as carbonate (TUMS ULTRA 1000) 400 MG chewable tablet Chew by mouth daily as needed.      clopidogrel (PLAVIX) 75 MG tablet      diclofenac Sodium (VOLTAREN) 1 % GEL Apply topically.     furosemide (LASIX) 20 MG tablet      ibuprofen (ADVIL) 100 MG/5ML suspension Take 200 mg by mouth every 4 (four) hours as needed.      lisinopril-hydrochlorothiazide (ZESTORETIC) 10-12.5 MG tablet Take by mouth.     metoprolol succinate (TOPROL-XL) 50 MG 24 hr tablet Take by mouth.     nitroGLYCERIN (NITROSTAT) 0.4 MG SL tablet      nystatin (MYCOSTATIN/NYSTOP) powder Apply 1 application topically 3 (three) times daily. 60 g 3   oxyCODONE (OXY IR/ROXICODONE) 5 MG immediate release tablet Take 5 mg by mouth every 4 (four) hours as needed.      polyethylene glycol powder (GLYCOLAX/MIRALAX) 17 GM/SCOOP powder Take 0.5 Containers by mouth daily.      senna-docusate (SENOKOT-S) 8.6-50 MG tablet Take by mouth.     silver sulfADIAZINE (SILVADENE) 1 % cream Apply 1 application topically daily. 85 g 2   No current facility-administered medications for this visit.   Review of Systems General:  no complaints Skin: no complaints Eyes: no complaints HEENT: no complaints Breasts: no complaints Pulmonary: no complaints Cardiac: no complaints Gastrointestinal: decreased appetite with weight gain Genitourinary/Sexual: no complaints Ob/Gyn: no complaints Musculoskeletal: no complaints Hematology: no complaints Neurologic/Psych: depression  Objective:  Physical Examination:  There were no vitals taken for this visit.  ECOG Performance status: 2-3   GENERAL: Patient is elderly female in no acute distress. Obese.Travels in wheel chair.  HEENT:  PERRL NODES:  No inguinal lymphadenopathy palpated.  ABDOMEN:  Soft, nontender, nondistended. No hernias/ascites/masses.   EXTREMITIES:  stable peripheral edema.   SKIN:  Erythema and desquamation under the pannus. White film across skin surface predominately over lateral pannus. Midline, skin intact.  NEURO:  Nonfocal. Well oriented.  Appropriate affect.  Pelvic: Chaperoned by CMA. EGBUS: normal. No other lesions identified. Cervix, Uterus, Adnexa: surgically absent. Vagina: no lesions. Tissue atrophic. Vaginal cuff well healed. Some bleeding with exam. BME: no palpable masses.  Smooth. Rectovaginal: deferred   Lab Review Lab Results  Component Value Date   WBC 5.1 02/08/2020   HGB 8.7 (L) 02/08/2020   HCT 28.2 (L) 02/08/2020   MCV 85.7 02/08/2020   PLT 198 02/08/2020     Chemistry      Component Value Date/Time   NA 137 02/08/2020 0844   K 3.8 02/08/2020 0844   CL 103 02/08/2020 0844   CO2 25 02/08/2020 0844   BUN 26 (H) 02/08/2020 0844   CREATININE 1.17 (H) 02/08/2020 0844      Component Value Date/Time   CALCIUM 8.6 (L) 02/08/2020 0844   ALKPHOS 58 02/08/2020 0844   AST 20 02/08/2020 0844   ALT 10 02/08/2020 0844   BILITOT 0.5 02/08/2020 0844     12/01/2019 Creatinine 1.8  Albumin 2.6  Radiologic Imaging: As noted   Assessment:  Calysta Craigo is a 76 y.o. female diagnosed with stage II  grade 2 endometrioid endometrial cancer (MSS, pMMR, p53 wildtype) with cervical stromal invasion and atypical inconclusive washings.  On 11/30/2019 she robot-assisted total laparoscopic hysterectomy, Bilateral salpingo-oophorectomy, Sentinel lymph node mapping, Bilateral pelvic sentinel lymph node biopsy, and Omental biopsy. Slow vaginal cuff healing, thought due to hypoalbuminemia, delayed adjuvant therapy.   Tumor Board recommendation was for 3 cycles of chemotherapy (p53 wild-type). Dr. Tasia Catchings felt that given her multiple medical problems, including HTN, HLD, CAD, history of MI in 2012, LVEF 50%, CKD, COPD, lupus nephritis, morbid obesity, PS 2-3, the recommendation was for adjuvant radiation, without adjuvant chemotherapy. Her PS seemed to be improving and Dr Theora Gianotti discussed adding chemotherapy and reviewed the goal of chemotherapy to reduce her recurrence risk, but that chemotherapy is associated with risks that may negatively affect her quality of life. She was not interested in chemotherapy. Her PS was 2. While she is able to make her own meals she is in a chair >50% of the day.   She finished radiation treatments with external pelvic RT and vaginal brachytherapy  with Dr. Baruch Gouty 1/22 .   No evidence of recurrent disease today. Clinically asymptomatic.   Elevated creatinine with known history of CKD. Urinary urgency and incontinence secondary to radiation has improved.   Leg swelling stable.  History of labial boil and skin candidiasis/intertriginous dermatitis of pannus  Medical co-morbidities complicating care: HTN and obesity, prior MI s/p 3 stents on Plavix, h/o CHF, chronic kidney disease, and COPD.  Plan:   Problem List Items Addressed This Visit       Genitourinary   Endometrial adenocarcinoma [facility])   Other Visit Diagnoses     Encounter for follow-up surveillance of endometrial cancer    -  Primary      Suggested return to clinic in 6 months with Dr Baruch Gouty and with [place] in 12 months for surveillance exam.   The patient's diagnosis, an outline of the further diagnostic and laboratory studies which will be required, the recommendation, and alternatives were discussed.  All questions were answered to the patient's satisfaction.  Verlon Au, NP  I personally interviewed and examined the patient. Agreed with the above/below plan of care. I have directly contributed to assessment and plan of care of this patient and educated and discussed with patient and family.  Mellody Drown, MD   CC:  Malachy Mood, MD [address] [place] [address] [phone]

## 2021-09-25 ENCOUNTER — Ambulatory Visit: Payer: Medicare HMO | Admitting: Radiation Oncology

## 2022-02-04 ENCOUNTER — Ambulatory Visit
Admission: RE | Admit: 2022-02-04 | Discharge: 2022-02-04 | Disposition: A | Discharge status: Home / Self Care | Payer: Medicare HMO | Source: Ambulatory Visit | Attending: Radiation Oncology | Admitting: Radiation Oncology

## 2022-02-04 ENCOUNTER — Encounter: Payer: Self-pay | Admitting: Radiation Oncology

## 2022-02-04 ENCOUNTER — Other Ambulatory Visit: Payer: Self-pay | Admitting: *Deleted

## 2022-02-04 VITALS — BP 129/71 | HR 66 | Temp 98.1°F | Ht 66.0 in

## 2022-02-04 DIAGNOSIS — C541 Malignant neoplasm of endometrium: Secondary | ICD-10-CM | POA: Diagnosis present

## 2022-02-04 DIAGNOSIS — Z923 Personal history of irradiation: Secondary | ICD-10-CM | POA: Insufficient documentation

## 2022-02-04 NOTE — Progress Notes (Signed)
Radiation Oncology Follow up Note  Name: Bridget Murphy   Date:   02/04/2022 MRN:  [id number] DOB: 08/28/1945    This 76 y.o. female presents to the clinic today for 1-1/2-year follow-up status post pelvic external beam radiation therapy plus vaginal brachytherapy for stage II high-grade endometrioid endometrial carcinoma.  REFERRING PROVIDER: Lynnell Jude, MD  HPI: Patient is a 76 year old female now out a year and a half having completed external beam radiation to her pelvis as well as vaginal brachytherapy without chemotherapy for stage II high-grade endometrioid carcinoma.  She did not have chemotherapy based on the patient's comorbidities including morbid obesity MRI, CHF COPD and stage III chronic renal disease.  She is seen today and is doing well she still has pains in her knees which is stable over time.  She specifically [place] vaginal discharge any increased lower urinary tract symptoms or diarrhea.  Her last pelvic exam was unremarkable by Dr Fransisca Connors..  She had a CT scan back in September which I reviewed showed no evidence of metastatic disease within the chest abdomen or pelvis does have known endometrial thickening is not well-appreciated this contrast examination.  COMPLICATIONS OF TREATMENT: none  FOLLOW UP COMPLIANCE: keeps appointments   PHYSICAL EXAM:  BP 129/71 (BP Location: Left Wrist, Patient Position: Sitting, Cuff Size: Small)   Pulse 66   Temp 98.1 F (36.7 C) (Tympanic)   Ht '5\' 6"'$  (1.676 m) Comment: stated ht  BMI 40.51 kg/m  Morbidly obese female wheelchair-bound in NAD.  Well-developed well-nourished patient in NAD. HEENT reveals PERLA, EOMI, discs not visualized.  Oral cavity is clear. No oral mucosal lesions are identified. Neck is clear without evidence of cervical or supraclavicular adenopathy. Lungs are clear to A&P. Cardiac examination is essentially unremarkable with regular rate and rhythm without murmur rub or thrill. Abdomen is benign with no organomegaly  or masses noted. Motor sensory and DTR levels are equal and symmetric in the upper and lower extremities. Cranial nerves II through XII are grossly intact. Proprioception is intact. No peripheral adenopathy or edema is identified. No motor or sensory levels are noted. Crude visual fields are within normal range.  RADIOLOGY RESULTS: CT scans reviewed compatible with above-stated findings  PLAN: Present time patient continues to do well with no evidence of disease.  She will see Dr. Fransisca Connors back in 6 months I have ordered a CT scan of chest abdomen pelvis prior to that visit.  I have asked to see her back in 1 year for follow-up.  Patient knows to call with any concerns.  I would like to take this opportunity to thank you for allowing me to participate in the care of your patient.Noreene Filbert, MD

## 2022-02-04 NOTE — Addendum Note (Signed)
Encounter addended by: Noreene Filbert, MD on: 02/04/2022 12:36 PM  Actions taken: Clinical Note Signed, Level of Service modified

## 2022-07-27 ENCOUNTER — Ambulatory Visit
Admission: RE | Admit: 2022-07-27 | Discharge: 2022-07-27 | Disposition: A | Discharge status: Home / Self Care | Payer: Medicare HMO | Source: Ambulatory Visit | Attending: Radiation Oncology | Admitting: Radiation Oncology

## 2022-07-27 DIAGNOSIS — C541 Malignant neoplasm of endometrium: Secondary | ICD-10-CM | POA: Insufficient documentation

## 2022-07-27 LAB — POCT I-STAT CREATININE: Creatinine, Ser: 1.5 mg/dL — ABNORMAL HIGH (ref 0.44–1.00)

## 2022-07-27 MED ORDER — IOHEXOL 300 MG/ML  SOLN: 100.0000 mL | Freq: Once | INTRAMUSCULAR | Status: DC | PRN

## 2022-07-27 MED ORDER — IOHEXOL 300 MG/ML  SOLN
75.0000 mL | Freq: Once | INTRAMUSCULAR | Status: AC | PRN
  Administered 2022-07-27: 75 mL via INTRAVENOUS

## 2022-08-05 ENCOUNTER — Ambulatory Visit: Payer: Medicare HMO

## 2022-08-12 ENCOUNTER — Inpatient Hospital Stay: Payer: Medicare HMO | Attending: Obstetrics and Gynecology | Admitting: Obstetrics and Gynecology

## 2022-08-12 ENCOUNTER — Encounter: Payer: Self-pay | Admitting: Obstetrics and Gynecology

## 2022-08-12 VITALS — BP 127/54 | HR 53 | Temp 97.6°F | Resp 20 | Wt 253.0 lb

## 2022-08-12 DIAGNOSIS — Z90710 Acquired absence of both cervix and uterus: Secondary | ICD-10-CM | POA: Insufficient documentation

## 2022-08-12 DIAGNOSIS — Z8542 Personal history of malignant neoplasm of other parts of uterus: Secondary | ICD-10-CM | POA: Diagnosis present

## 2022-08-12 DIAGNOSIS — Z08 Encounter for follow-up examination after completed treatment for malignant neoplasm: Secondary | ICD-10-CM | POA: Insufficient documentation

## 2022-08-12 DIAGNOSIS — Z90722 Acquired absence of ovaries, bilateral: Secondary | ICD-10-CM | POA: Insufficient documentation

## 2022-08-12 DIAGNOSIS — Z9221 Personal history of antineoplastic chemotherapy: Secondary | ICD-10-CM | POA: Insufficient documentation

## 2022-08-12 DIAGNOSIS — Z923 Personal history of irradiation: Secondary | ICD-10-CM | POA: Insufficient documentation

## 2022-08-12 DIAGNOSIS — C541 Malignant neoplasm of endometrium: Secondary | ICD-10-CM

## 2022-08-12 NOTE — Progress Notes (Signed)
Gynecologic Oncology Interval Visit   Referring Provider: Vena Austria, MD [address] 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 female who is seen in consultation from Dr. Bonney Aid for high-grade endometrial adenocarcinoma s/p external pelvic RT and vaginal brachytherapy with Dr. Rushie Chestnut 1/22 who returns to clinic for continued surveillance.   No new complaints today. Here for follow up.  Gynecologic Oncology History Bridget Murphy is a pleasant female who is seen in consultation from Dr. Bonney Aid for high-grade endometrial adenocarcinoma and postmenopausal bleeding. Please see prior notes for complete details.   Pelvic US 10/23/2019 The uterus is anteverted and measures 11.3 x 7.0 x 6.0 cm. Echo texture is heterogenous with evidence of focal masses. Within the uterus There are possibly submucosal fibroids vs. Polyps measuring 25.2 x 21.6 x 31.3 mm, 16.7 x 8.3 x 13.8 mm, 12.9 x 8.9 x 12.3 mm. There is also a thick endometrium. The lining in the cervical canal is thick also.    The Endometrium measures 29.0 mm. No IUD is seen.    Right Ovary measures 3.2 x 2.1 x 1.7 cm. It is normal in appearance. There is a follicle in the right ovary measuring 12.0 x 7.9 x 11.0 mm Left Ovary measures 2.5 x 2.2 x 1.0 cm. It is normal in appearance. Survey of the adnexa demonstrates no adnexal masses. There is no free fluid in the cul de sac.   Impression: 1. There are possible fibroids and or polyps within the endometrium canal.  2. The endometrial lining appears thick, as well as, the cervical canal lining.  3. The ovaries appear normal.  4. No IUD is seen.    10/23/2019 Endometrial biopsy performed   A. ENDOMETRIUM, BIOPSY:  - High-grade endometrial adenocarcinoma, FIGO grade 3.  - The carcinoma has mostly endometrioid features.    Preop CT CA/P 11/06/2019:   IMPRESSION: 1. No evidence of metastatic disease within the chest, abdomen, or pelvis. 2. Known endometrial thickening is not well appreciated on this noncontrast examination. 3. Extensive coronary artery calcification. Left anterior descending coronary artery stenting has been performed.  On 11/30/2019 she underwent Exam under anesthesia, Diagnostic laparoscopy, Robot-assisted total laparoscopic hysterectomy, Bilateral salpingo-oophorectomy, Sentinel lymph node mapping, Bilateral pelvic sentinel lymph node biopsy, and Omental biopsy.  DIAGNOSIS  A.  Uterus, bilateral ovaries and fallopian tubes, hysterectomy and bilateral salpingo-oophorectomy:   Endometrioid adenocarcinoma of the endometrium (9 cm), FIGO grade 2 of 3, invading 17 mm in a 21 mm thick myometrium, with stromal invasion of the cervix. Negative for lymphatic vascular space invasion.  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  Right ovary and fallopian tube: Negative for malignancy. Left ovary and fallopian tube: Negative for malignancy.  Microsatellite instability and mismatch repair protein immunohistochemistry: Pending, paraffin block A2, to be reported separately from the molecular diagnostics laboratory.  See synoptic report below.  B.  Right obturator, primary sentinel node, biopsy: One lymph node, negative for malignancy (0/1).  C.  Left external iliac artery sentinel lymph node, biopsy: One lymph node, negative for malignancy (0/1).  D.  Left obturator, primary sentinel lymph node, biopsy: One lymph node, negative for malignancy (0/1).  E.  Left large obturator lymph node, biopsy: One lymph node, negative for metastatic carcinoma (0/1).. See comment.  Comment: Because of the large size of this lymph node (4.2 cm), it will be referred to our hematopathology  division for additional review.   An addendum will follow.    F.  Left internal iliac, biopsy: One  lymph node, negative for malignancy (0/1).  G.  Omentum, omentectomy: Omentum, negative for malignancy.   Electronically signed by Rush Barer, MD on 12/11/2019 at 1559  Addendum 2  This part of hematopathology consultation (specimen E) has been reviewed in our weekly hematopathology QA conference, attended by Drs. Alvera Novel, ASL, JN, SW and EC besides me, and the interpretation and diagnosis rendered above reflect the consensus of the discussion among the attendee.  Addendum electronically signed by Lynnae Sandhoff, MD on 12/18/2019 at [facility]   Regarding the enlarged lymph node (left obturator, specimen E), microscopic examination reveals profiles of lymph node with somewhat preserved normal nodal architecture. The cellular constituents appear somewhat disorganized, with slightly expanded sinuses and abundant pink fibrillar hyaline material (with focal calcification) scattered throughout the node.   A panel of immunohistochemical stains was performed after review of H&E slides, on block E2, to assess the lymph node, with appropriate positive and negative controls:   IHC Result  CD3/CD20 Highlights small mature B-cells (follicles) and T-cells in appropriate distributions.  BCL2 Negative in germinal centers. Positive in scattered paracortical cells.  BCL6 Highlights germinal centers.  Ki67 Appropriately elevated in germinal centers, which appear well-defined with retained polarity. Otherwise very low proliferation index.  Kappa/Lambda Light chain expression appears polytypic.  S100 Essentially negative.  CD68 Highlights increased sinus histiocytes.    Taken together, the morphologic and immunophenotypic findings are consistent with reactive follicular hyperplasia with sinus histiocytosis. There is no histopathologic evidence of lymphoproliferative disorder. The etiology of the abundant sclerotic change with focal calcification is unclear.   Washings: Diagnostic  Interpretation A ATYPICAL/INCONCLUSIVE.  Rare atypical cells, too few to classify, see COMMENT. COMMENT: The cell block is paucicellular and does not contain the cells of interest, precluding further evaluation.  DMMR/MSI status  Mismatch repair: INTACT Immunohistochemistry: Normal result. Expression of MLH1, MSH2, MSH6, and PMS2 is retained PCR: Microsatellite stable (MSS)  P53 immunohistochemistry (block A1):   Negative (wild type pattern).  Tumor Board recommendations:  Recommendations: WPRT + VBT vs 3 cycles carboplatin and paclitaxel +VBT (GOG249 regimen), vs EBRT/cisplatin + 4 cycles carboplatin and paclitaxel (PORTEC3 regimen) vs consider VBT alone (not first choice due to grade, tumor size, deep invasion, and cervical stromal invasion)    She also has significant medical history with h/o MI, CHF, COPD, stage 3 chronic kidney disease, and elevated BMI.   Tumor Board recommendation was for 3 cycles of chemotherapy (p53 wild-type). Dr. Tasia Catchings felt that given her multiple medical problems, including HTN, HLD, CAD, history of MI in 2012, LVEF 50%, CKD, COPD, lupus nephritis, morbid obesity, PS 2-3, the recommendation was for adjuvant radiation, without adjuvant chemotherapy. Her PS seemed to be improving and Dr Theora Gianotti discussed adding chemotherapy and reviewed the goal of chemotherapy to reduce her recurrence risk, but that chemotherapy is associated with risks that may negatively affect her quality of life. She was not interested in chemotherapy. Her PS was 2. While she is able to make her own meals she is in a chair >50% of the day.   She finished radiation treatments with external pelvic RT and vaginal brachytherapy with Dr. Baruch Gouty 1/22 .    Germline testing: not indicated  Problem List: Patient Active Problem List   Diagnosis Date Noted   Goals of care, counseling/discussion 01/27/2020   CAD (coronary artery disease) 11/22/2019  Endometrial cancer (HCC) 11/21/2019   COVID-19  vaccine series completed 11/14/2019   Endometrial adenocarcinoma (HCC) 11/01/2019   Bilateral chronic knee pain 04/04/2019   Ds DNA antibody positive 04/04/2019   Lymphedema of both lower extremities 04/04/2019   Chronic gouty arthropathy without tophi 04/04/2019   Hyperparathyroidism due to renal insufficiency (HCC) 02/19/2019   Edema of lower extremity 02/19/2019   Gout 02/19/2019   Acute gout involving toe 09/28/2016   Systemic lupus erythematosus (HCC) 09/28/2016   Chronic diastolic CHF (congestive heart failure) (HCC) 09/11/2015   Stage 3 chronic kidney disease (HCC) 03/11/2015   Acute myocardial infarction, unspecified (HCC) 10/04/2013   Essential hypertension 10/04/2013   Hyperlipidemia 10/04/2013   Morbid obesity (HCC) 10/04/2013   Other specified postprocedural states 10/04/2013    Past Medical History: Past Medical History:  Diagnosis Date   Acute myocardial infarction [facility])    s/p three stents; anterior STEMI, DES LAD 09/25/2010   Arthritis    Chronic kidney disease    COPD (chronic obstructive pulmonary disease) (HCC)    Hyperlipidemia    Hypertension    Past Surgical History: Past Surgical History:  Procedure Laterality Date   HYSTERECTOMY ABDOMINAL WITH SALPINGO-OOPHORECTOMY  11/30/2019    Exam under anesthesia, Diagnostic laparoscopy, Robot-assisted total laparoscopic hysterectomy, Bilateral salpingo-oophorectomy, Sentinel lymph node mapping, Bilateral pelvic sentinel lymph node biopsy, and Omental biopsy.   Past Gynecologic History:  As per HPI  OB History:  OB History  Gravida Para Term Preterm AB Living  1 1          SAB IAB Ectopic Multiple Live Births               # Outcome Date GA Lbr Len/2nd Weight Sex Delivery Anes PTL Lv  1 Para             Obstetric Comments  SVD x 1   Family History: Family History  Problem Relation Age of Onset   Cervical cancer Mother    Cervical cancer Sister    Prostate cancer Brother    Prostate cancer Brother     Prostate cancer Brother    Immunization History  Administered Date(s) Administered   Influenza, High Dose Seasonal PF 11/14/2019   Moderna Sars-Covid-2 Vaccination 05/31/2019, 06/24/2019   PFIZER Comirnaty(Gray Top)Covid-19 Tri-Sucrose Vaccine 05/31/2019, 06/24/2019   Social History: Social History   Socioeconomic History   Marital status: Widowed    Spouse name: Not on file   Number of children: Not on file   Years of education: Not on file   Highest education level: Not on file  Occupational History   Not on file  Tobacco Use   Smoking status: Former    Packs/day: .5    Types: Cigarettes    Quit date: 12/25/2010    Years since quitting: 11.6   Smokeless tobacco: Never  Vaping Use   Vaping Use: Never used  Substance and Sexual Activity   Alcohol use: Not Currently   Drug use: Never   Sexual activity: Not on file  Other Topics Concern   Not on file  Social History Narrative   Not on file   Social Determinants of Health   Financial Resource Strain: Not on file  Food Insecurity: Not on file  Transportation Needs: Not on file  Physical Activity: Not on file  Stress: Not on file  Social Connections: Not on file  Intimate Partner Violence: Not on file   Allergies: Allergies  Allergen Reactions   Acetaminophen Other (See Comments)    "  Heart fluttering"   Furosemide Rash   Current Medications: Current Outpatient Medications  Medication Sig Dispense Refill   allopurinol (ZYLOPRIM) 100 MG tablet Take 50 mg by mouth daily.      aspirin 81 MG EC tablet Take 81 mg by mouth.      atorvastatin (LIPITOR) 40 MG tablet Take by mouth.     clopidogrel (PLAVIX) 75 MG tablet      diclofenac Sodium (VOLTAREN) 1 % GEL Apply topically.     famotidine (PEPCID) 10 MG tablet Take by mouth.     furosemide (LASIX) 20 MG tablet      ibuprofen (ADVIL) 100 MG/5ML suspension Take 200 mg by mouth every 4 (four) hours as needed.     lisinopril-hydrochlorothiazide (ZESTORETIC) 10-12.5  MG tablet Take by mouth.     metoprolol succinate (TOPROL-XL) 50 MG 24 hr tablet Take by mouth.     nitroGLYCERIN (NITROSTAT) 0.4 MG SL tablet      nystatin (MYCOSTATIN/NYSTOP) powder Apply 1 application topically 3 (three) times daily. 60 g 3   polyethylene glycol powder (GLYCOLAX/MIRALAX) 17 GM/SCOOP powder Take 0.5 Containers by mouth daily.      senna-docusate (SENOKOT-S) 8.6-50 MG tablet Take by mouth.     silver sulfADIAZINE (SILVADENE) 1 % cream Apply 1 application topically daily. 85 g 2   traMADol (ULTRAM) 50 MG tablet Take 50 mg by mouth 4 (four) times daily as needed.     ferrous sulfate 325 (65 FE) MG tablet Take by mouth.     No current facility-administered medications for this visit.   Review of Systems General:  no complaints Skin: no complaints Eyes: no complaints HEENT: no complaints Breasts: no complaints Pulmonary: no complaints Cardiac: no complaints Gastrointestinal: decreased appetite with weight gain Genitourinary/Sexual: no complaints Ob/Gyn: no complaints Musculoskeletal: no complaints Hematology: no complaints Neurologic/Psych: depression  Objective:  Physical Examination:  BP (!) 127/54   Pulse (!) 53   Temp 97.6 F (36.4 C)   Resp 20   Wt 253 lb (114.8 kg)   SpO2 100%   BMI 40.84 kg/m   ECOG Performance status: 2-3   GENERAL: Patient is elderly female in no acute distress. Obese.Travels in wheel chair.  HEENT:  PERRL NODES:  No inguinal lymphadenopathy palpated.  ABDOMEN:  Soft, nontender, nondistended. No hernias/ascites/masses.   EXTREMITIES:  stable peripheral edema.   SKIN:  Erythema and desquamation under the pannus. White film across skin surface predominately over lateral pannus. Midline, skin intact.  NEURO:  Nonfocal. Well oriented.  Appropriate affect.  Pelvic: Chaperoned by CMA. EGBUS: normal. No other lesions identified. Cervix, Uterus, Adnexa: surgically absent. Vagina: no lesions. Tissue atrophic. Vaginal cuff well healed.  Some bleeding with exam. BME: no palpable masses. Smooth. Rectovaginal: deferred   Lab Review Lab Results  Component Value Date   WBC 5.1 02/08/2020   HGB 8.7 (L) 02/08/2020   HCT 28.2 (L) 02/08/2020   MCV 85.7 02/08/2020   PLT 198 02/08/2020     Chemistry      Component Value Date/Time   NA 137 02/08/2020 0844   K 3.8 02/08/2020 0844   CL 103 02/08/2020 0844   CO2 25 02/08/2020 0844   BUN 26 (H) 02/08/2020 0844   CREATININE 1.50 (H) 07/27/2022 1028      Component Value Date/Time   CALCIUM 8.6 (L) 02/08/2020 0844   ALKPHOS 58 02/08/2020 0844   AST 20 02/08/2020 0844   ALT 10 02/08/2020 0844   BILITOT 0.5 02/08/2020 0844  12/01/2019 Creatinine 1.8  Albumin 2.6  Radiologic Imaging: As noted   Assessment:  Eunie Yake is a 77 y.o. female diagnosed with stage II grade 2 endometrioid endometrial cancer (MSS, pMMR, p53 wildtype) with cervical stromal invasion and atypical inconclusive washings.  On 11/30/2019 she robot-assisted total laparoscopic hysterectomy, Bilateral salpingo-oophorectomy, Sentinel lymph node mapping, Bilateral pelvic sentinel lymph node biopsy, and Omental biopsy. Slow vaginal cuff healing, thought due to hypoalbuminemia, delayed adjuvant therapy.   Tumor Board recommendation was for 3 cycles of chemotherapy (p53 wild-type). Dr. Cathie Hoops felt that given her multiple medical problems, including HTN, HLD, CAD, history of MI in 2012, LVEF 50%, CKD, COPD, lupus nephritis, morbid obesity, PS 2-3, the recommendation was for adjuvant radiation, without adjuvant chemotherapy. Her PS seemed to be improving and Dr Sonia Side discussed adding chemotherapy and reviewed the goal of chemotherapy to reduce her recurrence risk, but that chemotherapy is associated with risks that may negatively affect her quality of life. She was not interested in chemotherapy. Her PS was 2. While she is able to make her own meals she is in a chair >50% of the day.   She finished radiation treatments  with external pelvic RT and vaginal brachytherapy with Dr. Rushie Chestnut 1/22 .   No evidence of recurrent disease today. Clinically asymptomatic.   Elevated creatinine with known history of CKD. Urinary urgency and incontinence secondary to radiation has improved.   Leg swelling stable.  History of labial boil and skin candidiasis/intertriginous dermatitis of pannus  Medical co-morbidities complicating care: HTN and obesity, prior MI s/p 3 stents on Plavix, h/o CHF, chronic kidney disease, and COPD.  Plan:   Problem List Items Addressed This Visit       Genitourinary   Endometrial adenocarcinoma (HCC) - Primary   Suggested return to clinic in 3-4 months with Dr Rushie Chestnut and with [place] in 8 months for surveillance exam.   The patient's diagnosis, an outline of the further diagnostic and laboratory studies which will be required, the recommendation, and alternatives were discussed.  All questions were answered to the patient's satisfaction.  Leida Lauth, MD     CC:  Vena Austria, MD [address] [place],  [place] [address] [phone]

## 2022-09-25 ENCOUNTER — Other Ambulatory Visit: Payer: Self-pay | Admitting: Nurse Practitioner

## 2022-11-05 ENCOUNTER — Ambulatory Visit
Admission: RE | Admit: 2022-11-05 | Discharge: 2022-11-05 | Disposition: A | Discharge status: Home / Self Care | Payer: Medicare HMO | Source: Ambulatory Visit | Attending: Radiation Oncology | Admitting: Radiation Oncology

## 2022-11-05 ENCOUNTER — Encounter: Payer: Self-pay | Admitting: Radiation Oncology

## 2022-11-05 VITALS — BP 120/59 | HR 64 | Temp 96.6°F | Resp 16

## 2022-11-05 DIAGNOSIS — Z923 Personal history of irradiation: Secondary | ICD-10-CM | POA: Diagnosis not present

## 2022-11-05 DIAGNOSIS — Z8542 Personal history of malignant neoplasm of other parts of uterus: Secondary | ICD-10-CM | POA: Insufficient documentation

## 2022-11-05 DIAGNOSIS — C541 Malignant neoplasm of endometrium: Secondary | ICD-10-CM

## 2022-11-05 NOTE — Progress Notes (Signed)
Radiation Oncology Follow up Note  Name: Bridget Murphy   Date:   11/05/2022 MRN:  [id number] DOB: May 24, 1945    This 77 y.o. female presents to the clinic today for 2-1/2-year follow-up status post external beam radiation therapy to her pelvis plus vaginal brachytherapy for stage II high-grade endometrial carcinoma.  REFERRING PROVIDER: Dortha Kern, MD  HPI: Patient is a 76 year old female now out 2-1/2 years having completed external beam radiation therapy to her pelvis and vaginal brachytherapy for stage II high-grade endometrial carcinoma seen today in routine follow-up she is doing well specifically denies any change in her lower urinary tract symptoms.  No diarrhea no abdominal or pelvic pain.  She had CT scans of chest abdomen pelvis back in June showing no evidence of recurrent or metastatic disease there was a new 0.4 cm nodule in the peripheral anterior right lower lobe which will be followed.  COMPLICATIONS OF TREATMENT: none  FOLLOW UP COMPLIANCE: keeps appointments   PHYSICAL EXAM:  BP (!) 120/59 (BP Location: Right Arm, Patient Position: Sitting)   Pulse 64   Temp (!) 96.6 F (35.9 C) (Tympanic)   Resp 16  Morbidly obese female wheelchair-bound in NAD.  Well-developed well-nourished patient in NAD. HEENT reveals PERLA, EOMI, discs not visualized.  Oral cavity is clear. No oral mucosal lesions are identified. Neck is clear without evidence of cervical or supraclavicular adenopathy. Lungs are clear to A&P. Cardiac examination is essentially unremarkable with regular rate and rhythm without murmur rub or thrill. Abdomen is benign with no organomegaly or masses noted. Motor sensory and DTR levels are equal and symmetric in the upper and lower extremities. Cranial nerves II through XII are grossly intact. Proprioception is intact. No peripheral adenopathy or edema is identified. No motor or sensory levels are noted. Crude visual fields are within normal range.  RADIOLOGY RESULTS: CT  scans reviewed compatible with above-stated findings  PLAN: Present time patient is doing well with no evidence of disease now out 2-1/2 years from external beam treatment.  I am going to turn follow-up care over to GYN oncology.  I will be happy to reevaluate this patient anytime should that be indicated.  I would like to take this opportunity to thank you for allowing me to participate in the care of your patient.Bridget Miller, MD

## 2023-02-04 ENCOUNTER — Ambulatory Visit: Payer: Medicare HMO | Admitting: Radiation Oncology

## 2023-02-10 ENCOUNTER — Inpatient Hospital Stay: Payer: Medicare HMO | Attending: Oncology

## 2023-05-28 ENCOUNTER — Other Ambulatory Visit: Payer: Self-pay | Admitting: Nurse Practitioner
